# Patient Record
Sex: Male | Born: 2000 | Race: White | Hispanic: No | Marital: Single | Smoking: Current some day smoker
Health system: Southern US, Community
[De-identification: ages and names within clinical notes are randomized; demographics above are authoritative.]

## PROBLEM LIST (undated history)

## (undated) DIAGNOSIS — T7840XA Allergy, unspecified, initial encounter: Secondary | ICD-10-CM

## (undated) DIAGNOSIS — U071 COVID-19: Secondary | ICD-10-CM

## (undated) HISTORY — PX: NASAL SINUS SURGERY: SHX719

## (undated) HISTORY — DX: Allergy, unspecified, initial encounter: T78.40XA

## (undated) HISTORY — PX: WISDOM TOOTH EXTRACTION: SHX21

## (undated) HISTORY — PX: APPENDECTOMY: SHX54

---
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
## 2019-01-02 ENCOUNTER — Other Ambulatory Visit: Payer: Self-pay

## 2019-01-02 DIAGNOSIS — Z20822 Contact with and (suspected) exposure to covid-19: Secondary | ICD-10-CM

## 2019-01-03 LAB — NOVEL CORONAVIRUS, NAA: SARS-CoV-2, NAA: DETECTED — AB

## 2020-01-09 ENCOUNTER — Other Ambulatory Visit: Payer: Self-pay | Admitting: Family

## 2020-01-09 ENCOUNTER — Ambulatory Visit
Admission: RE | Admit: 2020-01-09 | Discharge: 2020-01-09 | Disposition: A | Discharge status: Home / Self Care | Payer: 59 | Source: Ambulatory Visit | Attending: Family | Admitting: Family

## 2020-01-09 DIAGNOSIS — R051 Acute cough: Secondary | ICD-10-CM

## 2020-07-13 ENCOUNTER — Ambulatory Visit: Payer: Self-pay | Admitting: Allergy and Immunology

## 2020-07-23 ENCOUNTER — Ambulatory Visit: Payer: 59 | Admitting: Allergy and Immunology

## 2020-07-23 ENCOUNTER — Encounter: Payer: Self-pay | Admitting: Allergy and Immunology

## 2020-07-23 ENCOUNTER — Other Ambulatory Visit: Payer: Self-pay

## 2020-07-23 ENCOUNTER — Ambulatory Visit (INDEPENDENT_AMBULATORY_CARE_PROVIDER_SITE_OTHER): Payer: 59 | Admitting: Allergy and Immunology

## 2020-07-23 VITALS — BP 110/66 | HR 65 | Temp 98.2°F | Resp 16 | Ht 72.0 in | Wt 162.0 lb

## 2020-07-23 DIAGNOSIS — F17210 Nicotine dependence, cigarettes, uncomplicated: Secondary | ICD-10-CM | POA: Diagnosis not present

## 2020-07-23 DIAGNOSIS — H1013 Acute atopic conjunctivitis, bilateral: Secondary | ICD-10-CM

## 2020-07-23 DIAGNOSIS — F1290 Cannabis use, unspecified, uncomplicated: Secondary | ICD-10-CM

## 2020-07-23 DIAGNOSIS — J301 Allergic rhinitis due to pollen: Secondary | ICD-10-CM | POA: Diagnosis not present

## 2020-07-23 DIAGNOSIS — J3089 Other allergic rhinitis: Secondary | ICD-10-CM

## 2020-07-23 DIAGNOSIS — H1010 Acute atopic conjunctivitis, unspecified eye: Secondary | ICD-10-CM

## 2020-07-23 MED ORDER — MONTELUKAST SODIUM 10 MG PO TABS: 10.0000 mg | ORAL_TABLET | Freq: Every day | ORAL | 5 refills | Status: DC

## 2020-07-23 NOTE — Patient Instructions (Addendum)
  1.  Allergen avoidance measures - dust mite, cat, dog, pollens, molds  2.  Use THC and nicotine substitutes to eliminate smoke exposure  3.  Treat and prevent inflammation:  A.  Triamcinolone nasal spray -1 spray each nostril 1-2 times per day B.  Montelukast 10 mg -1 tablet 1 time per day  4.  If needed:  A. Nasal saline B. Cetirizine 10 mg - 1 tablet 1 time per day C. Pataday - 1 drop each eye 1 time per day  5. Immunotherapy???  6. Return to clinic in 12 weeks or earlier if problem  7.  Obtain fall flu vaccine  8. Return in 6 months or earlier if problem

## 2020-07-23 NOTE — Progress Notes (Signed)
[place] [facility] [place] - [place] - [place]   Dear Scott Benson,  Thank you for referring Scott Benson to the [facility] [place] on 07/23/2020.   Below is a summation of this patient's evaluation and recommendations.  Thank you for your referral. I will keep you informed about this patient's response to treatment.   If you have any questions please do not hesitate to contact me.   Sincerely,  Scott Priest, MD Allergy / Immunology [facility]   ______________________________________________________________________    NEW PATIENT NOTE  Referring Provider: Sheran Spine, NP Primary Provider: Shelva Majestic, MD Date of office visit: 07/23/2020    Subjective:   Chief Complaint:  Scott Benson (DOB: February 06, 2000) is a 20 y.o. male who presents to the clinic on 07/23/2020 with a chief complaint of Allergies .     HPI: Scott Benson presents to this clinic in evaluation of rhinitis.  He has a long history of rhinitis manifested for the most part as nasal congestion and apparently he has had a history of nasal polyps and has required a right-sided nasal polypectomy about 5 years ago.  He does have some runny nose usually of clear nasal discharge and he may have some slight sneezing but is predominantly nasal congestion that bothers him the most.  Provoking factors for his symptoms include exposure to dust and pollens and cats and some of these allergens will also cause him to have some irritation of his eyes.  He does smoke marijuana and tobacco on a daily basis.  He sometimes also vapes.  History reviewed. No pertinent past medical history.  Past Surgical History:  Procedure Laterality Date   APPENDECTOMY     LAPAROSCOPIC APPENDECTOMY N/A 03/27/2012   Procedure: APPENDECTOMY LAPAROSCOPIC;  Surgeon: Scott Petit. [name], MD;  Location: [facility] OR;  Service: Pediatrics;   Laterality: N/A;   NASAL SINUS SURGERY Right    polyp   WISDOM TOOTH EXTRACTION      Allergies as of 07/23/2020   No Known Allergies      Medication List   Triamcinolone nasal spray    Review of systems negative except as noted in HPI / PMHx or noted below:  Review of Systems  Constitutional: Negative.   HENT: Negative.    Eyes: Negative.   Respiratory: Negative.    Cardiovascular: Negative.   Gastrointestinal: Negative.   Genitourinary: Negative.   Musculoskeletal: Negative.   Skin: Negative.   Neurological: Negative.   Endo/Heme/Allergies: Negative.   Psychiatric/Behavioral: Negative.     Family History  Problem Relation Age of Onset   Allergic rhinitis Mother    Allergic rhinitis Father    Allergic rhinitis Sister    Food Allergy Sister    Asthma Neg Hx    Eczema Neg Hx    Urticaria Neg Hx    Immunodeficiency Neg Hx    Angioedema Neg Hx    Atopy Neg Hx     Social History   Socioeconomic History   Marital status: Single    Spouse name: Not on file   Number of children: Not on file   Years of education: Not on file   Highest education level: Not on file  Occupational History   Not on file  Tobacco Use   Smoking status: Every Day    Pack years: 0.00    Types: Cigarettes   Smokeless tobacco: Never  Vaping Use  Vaping Use: Every day  Substance and Sexual Activity   Alcohol use: No   Drug use: No   Sexual activity: Never  Other Topics Concern   Not on file  Social History Narrative   ** Merged History Encounter **       Environmental and Social history  Lives in a house with a dry environment, no animals located inside the household, carpet in the bedroom, no plastic on the bed, no plastic on the pillow, no smoking ongoing with inside the household.  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  Objective:   Vitals:   07/23/20 1403  BP: 110/66  Pulse: 65  Resp: 16   Temp: 98.2 F (36.8 C)  SpO2: 98%   Height: 6' (182.9 cm) Weight: 162 lb (73.5 kg)  Physical Exam Constitutional:      Appearance: He is not diaphoretic.  HENT:     Head: Normocephalic.     Right Ear: Tympanic membrane, ear canal and external ear normal.     Left Ear: Tympanic membrane, ear canal and external ear normal.     Nose: Mucosal edema present. No rhinorrhea.     Mouth/Throat:     Pharynx: Uvula midline. No oropharyngeal exudate.  Eyes:     Conjunctiva/sclera: Conjunctivae normal.  Neck:     Thyroid: No thyromegaly.     Trachea: Trachea normal. No tracheal tenderness or tracheal deviation.  Cardiovascular:     Rate and Rhythm: Normal rate and regular rhythm.     Heart sounds: Normal heart sounds, S1 normal and S2 normal. No murmur heard. Pulmonary:     Effort: No respiratory distress.     Breath sounds: Normal breath sounds. No stridor. No wheezing or rales.  Lymphadenopathy:     Head:     Right side of head: No tonsillar adenopathy.     Left side of head: No tonsillar adenopathy.     Cervical: No cervical adenopathy.  Skin:    Findings: No erythema or rash.     Nails: There is no clubbing.  Neurological:     Mental Status: He is alert.    Diagnostics: Allergy skin tests were performed.  He demonstrated hypersensitivity to house dust mite, cat, dog, trees, grasses, weeds, and molds.  Assessment and Plan:    1. Perennial allergic rhinitis   2. Seasonal allergic rhinitis due to pollen   3. Seasonal allergic conjunctivitis   4. Light tobacco smoker <10 cigarettes per day   5. Marijuana smoker     1.  Allergen avoidance measures - dust mite, cat, dog, pollens, molds  2.  Use THC and nicotine substitutes to eliminate smoke exposure  3.  Treat and prevent inflammation:  A.  Triamcinolone nasal spray -1 spray each nostril 1-2 times per day B.  Montelukast 10 mg -1 tablet 1 time per day  4.  If needed:  A. Nasal saline B. Cetirizine 10 mg - 1 tablet  1 time per day C. Pataday - 1 drop each eye 1 time per day  5. Immunotherapy???  6. Return to clinic in 12 weeks or earlier if problem  7.  Obtain fall flu vaccine  8. Return in 6 months or earlier if problem  Scott Benson has inflammation of his airway and conjunctiva secondary to a combination of atopic disease and smoke exposure and we will attempt to get his issues under better  control with a collection of allergen avoidance measures, eliminating smoke exposure, and using anti-inflammatory agents for his airway on a pretty consistent basis.  I will see him back in his clinic in 12 weeks to assess his response to this approach and if he fails medical therapy he would definitely be a candidate for immunotherapy.  We have given him literature on this form of therapy during today's visit.  Scott Priest, MD Allergy / Immunology [facility] [place]

## 2020-07-28 ENCOUNTER — Encounter: Payer: Self-pay | Admitting: Allergy and Immunology

## 2020-09-17 ENCOUNTER — Encounter: Payer: Self-pay | Admitting: Family Medicine

## 2020-09-17 ENCOUNTER — Ambulatory Visit (INDEPENDENT_AMBULATORY_CARE_PROVIDER_SITE_OTHER): Payer: 59 | Admitting: Family Medicine

## 2020-09-17 ENCOUNTER — Other Ambulatory Visit (HOSPITAL_COMMUNITY)
Admission: RE | Admit: 2020-09-17 | Discharge: 2020-09-17 | Disposition: A | Discharge status: Home / Self Care | Payer: 59 | Source: Ambulatory Visit | Attending: Family Medicine | Admitting: Family Medicine

## 2020-09-17 ENCOUNTER — Other Ambulatory Visit: Payer: Self-pay

## 2020-09-17 VITALS — BP 97/59 | HR 66 | Temp 98.6°F | Ht 72.0 in | Wt 157.8 lb

## 2020-09-17 DIAGNOSIS — Z113 Encounter for screening for infections with a predominantly sexual mode of transmission: Secondary | ICD-10-CM | POA: Diagnosis present

## 2020-09-17 DIAGNOSIS — Z118 Encounter for screening for other infectious and parasitic diseases: Secondary | ICD-10-CM

## 2020-09-17 DIAGNOSIS — R310 Gross hematuria: Secondary | ICD-10-CM

## 2020-09-17 DIAGNOSIS — G8929 Other chronic pain: Secondary | ICD-10-CM | POA: Diagnosis not present

## 2020-09-17 DIAGNOSIS — Z114 Encounter for screening for human immunodeficiency virus [HIV]: Secondary | ICD-10-CM

## 2020-09-17 DIAGNOSIS — Z0000 Encounter for general adult medical examination without abnormal findings: Secondary | ICD-10-CM | POA: Insufficient documentation

## 2020-09-17 DIAGNOSIS — M545 Low back pain, unspecified: Secondary | ICD-10-CM | POA: Diagnosis not present

## 2020-09-17 DIAGNOSIS — Z1159 Encounter for screening for other viral diseases: Secondary | ICD-10-CM | POA: Diagnosis not present

## 2020-09-17 DIAGNOSIS — Z13220 Encounter for screening for lipoid disorders: Secondary | ICD-10-CM | POA: Diagnosis not present

## 2020-09-17 DIAGNOSIS — F17200 Nicotine dependence, unspecified, uncomplicated: Secondary | ICD-10-CM

## 2020-09-17 DIAGNOSIS — Z23 Encounter for immunization: Secondary | ICD-10-CM

## 2020-09-17 DIAGNOSIS — T7840XA Allergy, unspecified, initial encounter: Secondary | ICD-10-CM | POA: Insufficient documentation

## 2020-09-17 LAB — POC URINALSYSI DIPSTICK (AUTOMATED)
Bilirubin, UA: NEGATIVE
Blood, UA: NEGATIVE
Glucose, UA: NEGATIVE
Ketones, UA: NEGATIVE
Leukocytes, UA: NEGATIVE
Nitrite, UA: NEGATIVE
Protein, UA: NEGATIVE
Spec Grav, UA: 1.015 (ref 1.010–1.025)
Urobilinogen, UA: 0.2 E.U./dL
pH, UA: 6.5 (ref 5.0–8.0)

## 2020-09-17 NOTE — Patient Instructions (Addendum)
Health Maintenance Due  Topic Date Due   HPV VACCINES (1 - Male 2-dose series)  Gardasil today. Schedule nurse visit at 2 months and 6 months for repeats. Never done   TETANUS/TDAP today. Never done   Thank you for doing labs today If you have mychart- we will send your results within 3 business days of [place] receiving them.  If you do not have mychart- we will call you about results within 5 business days of [place] receiving them.  *please also note that you will see labs on mychart as soon as they post. I will later go in and write notes on them- will say "notes from Dr. Durene Cal"  In regards to you questions about your lung function, I do think it would be best to quit smoking in the future and increase the amount of exercise you get per week - preferably 150 minutes per week once your back heals.  I do recommend that you start using sunscreen.  I do encourage you to start using condoms.  It was great to meet you today!  We will call you within two weeks about your referral to hematuria. If you do not hear within 2 weeks, give [place] a call.    Recommended follow up: Return in about 1 year (around 09/17/2021) for physical or sooner if needed.

## 2020-09-17 NOTE — Progress Notes (Signed)
Phone: [phone]   Subjective:  Patient presents today to establish care and for physical.  Prior patient of [facility] peds.  Chief Complaint  Patient presents with   Establish Care   See problem oriented charting- ROS- full  review of systems was completed and negative  per full ROS sheet- did admit to occasional  cold sores  The following were reviewed and entered/updated in epic: Past Medical History:  Diagnosis Date   seasonal allergies    flonase as needed works well   Patient Active Problem List   Diagnosis Date Noted   seasonal allergies 09/17/2020   Past Surgical History:  Procedure Laterality Date   LAPAROSCOPIC APPENDECTOMY N/A 03/27/2012   Procedure: APPENDECTOMY LAPAROSCOPIC;  Surgeon: Judie Petit. [name], MD;  Location: [facility] OR;  Service: Pediatrics;  Laterality: N/A;   NASAL SINUS SURGERY Right    polyp   WISDOM TOOTH EXTRACTION      Family History  Problem Relation Age of Onset   Arthritis Mother    Hypertension Father    Heart attack Father        age 66   Hyperlipidemia Father    Allergic rhinitis Sister    Food Allergy Sister    Healthy Sister    Alcohol abuse Paternal Grandfather    Cancer Paternal Grandfather        thyroid and other   Asthma Neg Hx    Eczema Neg Hx    Urticaria Neg Hx    Immunodeficiency Neg Hx    Angioedema Neg Hx    Atopy Neg Hx     Medications- reviewed and updated No current outpatient medications on file.   No current facility-administered medications for this visit.    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 Oropharynx normal Neck: no thyromegaly CV: RRR no murmurs rubs or gallops Lungs: CTAB no crackles, wheeze, rhonchi Abdomen: soft/nontender/nondistended/normal bowel sounds. No rebound or guarding.  Ext: no edema Skin: warm, dry Neuro: grossly normal, moves all extremities, PERRLA     Assessment and Plan:  20 y.o. male presenting for annual physical.  Health Maintenance counseling: 1. Anticipatory guidance: Patient counseled regarding regular dental exams -q6 months, eye exams - no vision issues,  avoiding smoking and second hand smoke- see below , limiting alcohol to 2 beverages per day- 6 per week.   2. Risk factor reduction:  Advised patient of need for regular exercise and diet rich and fruits and vegetables to reduce risk of heart attack and stroke. Exercise- trying to get back to this when back better- goal 5 days a week again once back heals. Diet-oatmeal for breakfast with frozen berries and 3 eggs- doesn't eat well during the day.  Wt Readings from Last 3 Encounters:  09/17/20 157 lb 12.8 oz (71.6 kg)  07/23/20 162 lb (73.5 kg)  11/11/14 136 lb 9 oz (61.9 kg) (61 %,  Z= 0.28)*   * Growth percentiles are based on CDC (Boys, 2-20 Years) data.  3. Immunizations/screenings/ancillary studies. Tdap today. Wants to do gardasil as well.  HC screen opts in.  Immunization History  Administered Date(s) Administered   PFIZER Comirnaty(Gray Top)Covid-19 Tri-Sucrose Vaccine 10/22/2019, 11/12/2019   4. Prostate cancer screening- no family history, start at age 83  5. Colon cancer screening - no family history, start at age 77 6. Skin cancer screening/prevention- no dermatologist. advised regular sunscreen use. Denies worrisome, changing, or new skin lesions.  7. Testicular cancer screening- advised monthly self exams  8. STD screening- patient opts in. Advised to use protection everytime.  9. current smoker- cigars advised to stop. Advised  stopping marijuana as well- he is worried about lung function- advised do edibles if he is not willing to stop marijuana completely  Status of chronic or acute concerns   #allergies controlled with sparing flonase. Is not needing singulair anymore through  (states never took) Proofreader.   #Gross hematuria-patient with an episode when he was on a cruise back in March where he was drinking a lot of alcohol but not drinking much water at all-he was urinating and noted a clear discharge and later light amount of blood.  He was screened for STDs when he returned home and these were negative.  He has not had any episodes of blood in the urine since that time but did have 1 other episode of some slight clear discharge.  We are screening for STDs but we are also can refer to urology for their expert opinion on gross hematuria  Recommended follow up: No follow-ups on file. Future Appointments  Date Time Provider Department Center  10/26/2020 10:00 AM Kozlow, Alvira Philips, MD [facility] None   Lab/Order associations:NOT fasting   ICD-10-CM   1. Preventative health care  Z00.00 Hepatitis C antibody    HIV Antibody (routine testing w rflx)    RPR    Urine cytology ancillary only    CBC with Differential/Platelet    Comprehensive metabolic panel    Lipid panel    Urine cytology ancillary only    2. Encounter for hepatitis C screening test for low risk patient  Z11.59 Hepatitis C antibody    3. Screening for HIV (human immunodeficiency virus)  Z11.4 HIV Antibody (routine testing w rflx)    4. Screening for venereal disease  Z11.3 RPR    5. Screening for gonorrhea  Z11.3 Urine cytology ancillary only    Urine cytology ancillary only    6. Screening for chlamydial disease  Z11.8 Urine cytology ancillary only    Urine cytology ancillary only    7. Screening for hyperlipidemia  Z13.220 Lipid panel    8. Chronic bilateral low back pain without sciatica  M54.50 CBC with Differential/Platelet   G89.29  Comprehensive metabolic panel    9. Current smoker  F17.200 POCT Urinalysis Dipstick (Automated)     No orders of the defined types were placed in this encounter.   Return precautions advised.   Tana Conch, MD

## 2020-09-18 LAB — COMPREHENSIVE METABOLIC PANEL
ALT: 16 U/L (ref 0–53)
AST: 21 U/L (ref 0–37)
Albumin: 4.6 g/dL (ref 3.5–5.2)
Alkaline Phosphatase: 60 U/L (ref 39–117)
BUN: 15 mg/dL (ref 6–23)
CO2: 26 mEq/L (ref 19–32)
Calcium: 9.8 mg/dL (ref 8.4–10.5)
Chloride: 104 mEq/L (ref 96–112)
Creatinine, Ser: 0.96 mg/dL (ref 0.40–1.50)
GFR: 113.08 mL/min (ref >60.00)
Glucose, Bld: 81 mg/dL (ref 70–99)
Potassium: 4.5 mEq/L (ref 3.5–5.1)
Sodium: 138 mEq/L (ref 135–145)
Total Bilirubin: 0.4 mg/dL (ref 0.2–1.2)
Total Protein: 7.4 g/dL (ref 6.0–8.3)

## 2020-09-18 LAB — LIPID PANEL
Cholesterol: 163 mg/dL (ref 0–200)
HDL: 48.4 mg/dL (ref >39.00)
LDL Cholesterol: 99 mg/dL (ref 0–99)
NonHDL: 114.62
Total CHOL/HDL Ratio: 3
Triglycerides: 76 mg/dL (ref 0.0–149.0)
VLDL: 15.2 mg/dL (ref 0.0–40.0)

## 2020-09-18 LAB — CBC WITH DIFFERENTIAL/PLATELET
Basophils Absolute: 0.1 10*3/uL (ref 0.0–0.1)
Basophils Relative: 0.7 % (ref 0.0–3.0)
Eosinophils Absolute: 0.7 10*3/uL (ref 0.0–0.7)
Eosinophils Relative: 10.2 % — ABNORMAL HIGH (ref 0.0–5.0)
HCT: 44 % (ref 39.0–52.0)
Hemoglobin: 15.2 g/dL (ref 13.0–17.0)
Lymphocytes Relative: 31 % (ref 12.0–46.0)
Lymphs Abs: 2.1 10*3/uL (ref 0.7–4.0)
MCHC: 34.5 g/dL (ref 30.0–36.0)
MCV: 89.9 fl (ref 78.0–100.0)
Monocytes Absolute: 0.4 10*3/uL (ref 0.1–1.0)
Monocytes Relative: 5.8 % (ref 3.0–12.0)
Neutro Abs: 3.6 10*3/uL (ref 1.4–7.7)
Neutrophils Relative %: 52.3 % (ref 43.0–77.0)
Platelets: 201 10*3/uL (ref 150.0–400.0)
RBC: 4.89 Mil/uL (ref 4.22–5.81)
RDW: 12.1 % (ref 11.5–15.5)
WBC: 6.9 10*3/uL (ref 4.0–10.5)

## 2020-09-18 LAB — HEPATITIS C ANTIBODY
Hepatitis C Ab: NONREACTIVE
SIGNAL TO CUT-OFF: 0.04 (ref <1.00)

## 2020-09-18 LAB — HIV ANTIBODY (ROUTINE TESTING W REFLEX): HIV 1&2 Ab, 4th Generation: NONREACTIVE

## 2020-09-18 LAB — RPR: RPR Ser Ql: NONREACTIVE

## 2020-09-21 LAB — URINE CYTOLOGY ANCILLARY ONLY
Chlamydia: NEGATIVE
Comment: NEGATIVE
Comment: NEGATIVE
Comment: NORMAL
Neisseria Gonorrhea: NEGATIVE
Trichomonas: NEGATIVE

## 2020-10-16 ENCOUNTER — Encounter: Payer: Self-pay | Admitting: Physician Assistant

## 2020-10-16 ENCOUNTER — Other Ambulatory Visit (HOSPITAL_COMMUNITY)
Admission: RE | Admit: 2020-10-16 | Discharge: 2020-10-16 | Disposition: A | Discharge status: Home / Self Care | Payer: 59 | Source: Ambulatory Visit | Attending: Physician Assistant | Admitting: Physician Assistant

## 2020-10-16 ENCOUNTER — Ambulatory Visit (INDEPENDENT_AMBULATORY_CARE_PROVIDER_SITE_OTHER): Payer: 59 | Admitting: Physician Assistant

## 2020-10-16 ENCOUNTER — Other Ambulatory Visit: Payer: Self-pay

## 2020-10-16 VITALS — BP 118/80 | HR 80 | Temp 98.2°F | Ht 72.0 in | Wt 152.0 lb

## 2020-10-16 DIAGNOSIS — Z7251 High risk heterosexual behavior: Secondary | ICD-10-CM | POA: Insufficient documentation

## 2020-10-16 NOTE — Progress Notes (Signed)
Scott Benson is a 20 y.o. male is here to discuss: STD testing  I acted as a Neurosurgeon for [employer], PA-C Corky Mull, LPN   History of Present Illness:   Chief Complaint  Patient presents with   STD testing    HPI  STD testing Pt is wanting STD testing again today due to unprotected sex multiple times since last checked in August. He reports that last week he had burning with ejaculation 2 times.  He is not using condoms.  He reports that he has never had an STD.  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  He denies any: Discharge from penis, dysuria, lesions on penis or rectum.  Health Maintenance Due  Topic Date Due   HPV VACCINES (2 - Male 3-dose series) 10/15/2020    Past Medical History:  Diagnosis Date   seasonal allergies    flonase as needed works well     Social History   Tobacco Use   Smoking status: Some Days    Types: Cigars   Smokeless tobacco: Former    Types: Snuff  Vaping Use   Vaping Use: Former  Substance Use Topics   Alcohol use: Yes    Alcohol/week: 6.0 standard drinks    Types: 1 Glasses of wine, 5 Shots of liquor per week    Comment: usually less   Drug use: Yes    Types: Marijuana, Amphetamines, Cocaine, LSD, MDMA (Ecstacy)    Comment: currently using marijuana everything else is past usage.    Past Surgical History:  Procedure Laterality Date   LAPAROSCOPIC APPENDECTOMY N/A 03/27/2012   Procedure: APPENDECTOMY LAPAROSCOPIC;  Surgeon: Judie Petit. [name], MD;  Location: [facility];  Service: Pediatrics;  Laterality: N/A;   NASAL SINUS SURGERY Right    polyp   WISDOM TOOTH EXTRACTION      Family History  Problem Relation Age of Onset   Arthritis Mother    Hypertension Father    Heart attack Father        age 12   Hyperlipidemia Father    Allergic rhinitis Sister    Food Allergy Sister    Healthy Sister    Alcohol abuse Paternal Grandfather    Cancer Paternal Grandfather        thyroid and other   Asthma Neg Hx    Eczema Neg  Hx    Urticaria Neg Hx    Immunodeficiency Neg Hx    Angioedema Neg Hx    Atopy Neg Hx     PMHx, SurgHx, SocialHx, FamHx, Medications, and Allergies were reviewed in the Visit Navigator and updated as appropriate.   Patient Active Problem List   Diagnosis Date Noted   seasonal allergies 09/17/2020    Social History   Tobacco Use   Smoking status: Some Days    Types: Cigars   Smokeless tobacco: Former    Types: Snuff  Vaping Use   Vaping Use: Former  Substance Use Topics   Alcohol use: Yes    Alcohol/week: 6.0 standard drinks    Types: 1 Glasses of wine, 5 Shots of liquor per week    Comment: usually less   Drug use: Yes    Types: Marijuana, Amphetamines, Cocaine, LSD, MDMA (Ecstacy)    Comment: currently using marijuana everything else is past usage.    Current Medications and Allergies:   No current outpatient medications on file.  No Known Allergies  Review of Systems   ROS Negative unless otherwise specified per HPI.  Vitals:   Vitals:   10/16/20 1410  BP: 118/80  Pulse: 80  Temp: 98.2 F (36.8 C)  TempSrc: Temporal  SpO2: 97%  Weight: 152 lb (68.9 kg)  Height: 6' (1.829 m)     Body mass index is 20.61 kg/m.   Physical Exam:    Physical Exam Vitals and nursing note reviewed.  Constitutional:      General: He is not in acute distress.    Appearance: He is well-developed. He is not ill-appearing or toxic-appearing.  Cardiovascular:     Rate and Rhythm: Normal rate and regular rhythm.     Pulses: Normal pulses.     Heart sounds: Normal heart sounds, S1 normal and S2 normal.  Pulmonary:     Effort: Pulmonary effort is normal.     Breath sounds: Normal breath sounds.  Skin:    General: Skin is warm and dry.  Neurological:     Mental Status: He is alert.     GCS: GCS eye subscore is 4. GCS verbal subscore is 5. GCS motor subscore is 6.  Psychiatric:        Speech: Speech normal.        Behavior: Behavior normal. Behavior is cooperative.      Assessment and Plan:   High risk sexual behavior, unspecified type Update STD panel today Recommend regular use of condoms Will treat based on results Recommend that he avoid sex until results have returned If burning with ejaculation persist, follow-up with PCP or further evaluation  CMA or LPN served as scribe during this visit. History, Physical, and Plan performed by medical provider. The above documentation has been reviewed and is accurate and complete.  Jarold Motto, PA-C [place] [place] 10/16/2020  Follow-up: No follow-ups on file.

## 2020-10-16 NOTE — Patient Instructions (Signed)
It was great to see you!  We are checking your STD panel today Please avoid sex until we get your results Please use condoms regularly  Take care,  Jarold Motto PA-C

## 2020-10-19 LAB — URINE CYTOLOGY ANCILLARY ONLY
Chlamydia: NEGATIVE
Comment: NEGATIVE
Comment: NEGATIVE
Comment: NORMAL
Neisseria Gonorrhea: NEGATIVE
Trichomonas: NEGATIVE

## 2020-10-19 LAB — RPR: RPR Ser Ql: NONREACTIVE

## 2020-10-19 LAB — HIV ANTIBODY (ROUTINE TESTING W REFLEX): HIV 1&2 Ab, 4th Generation: NONREACTIVE

## 2020-10-26 ENCOUNTER — Ambulatory Visit: Payer: 59 | Admitting: Allergy and Immunology

## 2020-12-05 ENCOUNTER — Other Ambulatory Visit: Payer: Self-pay

## 2020-12-05 ENCOUNTER — Encounter (HOSPITAL_COMMUNITY): Payer: Self-pay | Admitting: Emergency Medicine

## 2020-12-05 ENCOUNTER — Ambulatory Visit (HOSPITAL_COMMUNITY)
Admission: EM | Admit: 2020-12-05 | Discharge: 2020-12-05 | Disposition: A | Discharge status: Home / Self Care | Payer: 59 | Attending: Physician Assistant | Admitting: Physician Assistant

## 2020-12-05 DIAGNOSIS — R21 Rash and other nonspecific skin eruption: Secondary | ICD-10-CM | POA: Diagnosis present

## 2020-12-05 DIAGNOSIS — B001 Herpesviral vesicular dermatitis: Secondary | ICD-10-CM | POA: Diagnosis not present

## 2020-12-05 DIAGNOSIS — L290 Pruritus ani: Secondary | ICD-10-CM | POA: Insufficient documentation

## 2020-12-05 LAB — CBC WITH DIFFERENTIAL/PLATELET
Abs Immature Granulocytes: 0.01 10*3/uL (ref 0.00–0.07)
Basophils Absolute: 0.1 10*3/uL (ref 0.0–0.1)
Basophils Relative: 1 %
Eosinophils Absolute: 0.8 10*3/uL — ABNORMAL HIGH (ref 0.0–0.5)
Eosinophils Relative: 13 %
HCT: 43 % (ref 39.0–52.0)
Hemoglobin: 15 g/dL (ref 13.0–17.0)
Immature Granulocytes: 0 %
Lymphocytes Relative: 32 %
Lymphs Abs: 2 10*3/uL (ref 0.7–4.0)
MCH: 30.8 pg (ref 26.0–34.0)
MCHC: 34.9 g/dL (ref 30.0–36.0)
MCV: 88.3 fL (ref 80.0–100.0)
Monocytes Absolute: 0.4 10*3/uL (ref 0.1–1.0)
Monocytes Relative: 7 %
Neutro Abs: 2.9 10*3/uL (ref 1.7–7.7)
Neutrophils Relative %: 47 %
Platelets: 225 10*3/uL (ref 150–400)
RBC: 4.87 MIL/uL (ref 4.22–5.81)
RDW: 11.9 % (ref 11.5–15.5)
WBC: 6.2 10*3/uL (ref 4.0–10.5)
nRBC: 0 % (ref 0.0–0.2)

## 2020-12-05 MED ORDER — PREDNISONE 10 MG PO TABS: 20.0000 mg | ORAL_TABLET | Freq: Every day | ORAL | 0 refills | Status: DC

## 2020-12-05 MED ORDER — VALACYCLOVIR HCL 1 G PO TABS: 1000.0000 mg | ORAL_TABLET | Freq: Three times a day (TID) | ORAL | 0 refills | Status: DC

## 2020-12-05 NOTE — ED Provider Notes (Signed)
[facility]    CSN: [id number] Arrival date & time: 12/05/20  1028      History   Chief Complaint Chief Complaint  Patient presents with   Rash    HPI Scott Benson is a 20 y.o. male.   Patient here c/w rash R thigh and anal itching x 2 days.  History of cold sores, notes cold sore started on R side lower lip same time as other rashes started.  He admits itching.  Denies f/c, URI sx, cough, wheezing, burning, pain, tenderness, abdominal pain, diarrhea, constipation.  No advil or tylenol today.  He is taking hydrocortisone and lotrimin w/o relief.  No new soaps, detergents, no new foods / restaurants.  No one with similar rash.   Past Medical History:  Diagnosis Date   seasonal allergies    flonase as needed works well    Patient Active Problem List   Diagnosis Date Noted   seasonal allergies 09/17/2020    Past Surgical History:  Procedure Laterality Date   LAPAROSCOPIC APPENDECTOMY N/A 03/27/2012   Procedure: APPENDECTOMY LAPAROSCOPIC;  Surgeon: Judie Petit. [name], MD;  Location: [facility];  Service: Pediatrics;  Laterality: N/A;   NASAL SINUS SURGERY Right    polyp   WISDOM TOOTH EXTRACTION         Home Medications    Prior to Admission medications   Medication Sig Start Date End Date Taking? Authorizing Provider  predniSONE (DELTASONE) 10 MG tablet Take 2 tablets (20 mg total) by mouth daily. 12/05/20  Yes Evern Core, PA-C  valACYclovir (VALTREX) 1000 MG tablet Take 1 tablet (1,000 mg total) by mouth 3 (three) times daily. 12/05/20  Yes Evern Core, PA-C    Family History Family History  Problem Relation Age of Onset   Arthritis Mother    Hypertension Father    Heart attack Father        age 55   Hyperlipidemia Father    Allergic rhinitis Sister    Food Allergy Sister    Healthy Sister    Alcohol abuse Paternal Grandfather    Cancer Paternal Grandfather        thyroid and other   Asthma Neg Hx    Eczema Neg Hx    Urticaria Neg  Hx    Immunodeficiency Neg Hx    Angioedema Neg Hx    Atopy Neg Hx     Social History Social History   Tobacco Use   Smoking status: Some Days    Types: Cigars   Smokeless tobacco: Former    Types: Snuff  Vaping Use   Vaping Use: Former  Substance Use Topics   Alcohol use: Yes    Alcohol/week: 6.0 standard drinks    Types: 1 Glasses of wine, 5 Shots of liquor per week    Comment: usually less   Drug use: Yes    Types: Marijuana, Amphetamines, Cocaine, LSD, MDMA (Ecstacy)    Comment: currently using marijuana everything else is past usage.     Allergies   Patient has no known allergies.   Review of Systems Review of Systems  Constitutional:  Negative for chills, fatigue and fever.  HENT:  Negative for congestion, ear pain, nosebleeds, postnasal drip, rhinorrhea, sinus pressure, sinus pain and sore throat.   Eyes:  Negative for pain and redness.  Respiratory:  Negative for cough, shortness of breath and wheezing.   Gastrointestinal:  Negative for abdominal distention, abdominal pain, anal bleeding, blood in stool, constipation, diarrhea, nausea, rectal pain  and vomiting.  Musculoskeletal:  Negative for arthralgias and myalgias.  Skin:  Positive for rash.  Neurological:  Negative for light-headedness and headaches.  Hematological:  Negative for adenopathy. Does not bruise/bleed easily.  Psychiatric/Behavioral:  Negative for confusion and sleep disturbance.     Physical Exam Triage Vital Signs ED Triage Vitals  Enc Vitals Group     BP 12/05/20 1216 130/68     Pulse Rate 12/05/20 1216 64     Resp 12/05/20 1216 15     Temp 12/05/20 1216 98.1 F (36.7 C)     Temp Source 12/05/20 1216 Oral     SpO2 12/05/20 1216 98 %     Weight --      Height --      Head Circumference --      Peak Flow --      Pain Score 12/05/20 1215 0     Pain Loc --      Pain Edu? --      Excl. in GC? --    No data found.  Updated Vital Signs BP 130/68 (BP Location: Left Arm)   Pulse  64   Temp 98.1 F (36.7 C) (Oral)   Resp 15   SpO2 98%   Visual Acuity Right Eye Distance:   Left Eye Distance:   Bilateral Distance:    Right Eye Near:   Left Eye Near:    Bilateral Near:     Physical Exam Vitals and nursing note reviewed.  Constitutional:      General: He is not in acute distress.    Appearance: Normal appearance. He is not ill-appearing.  HENT:     Head: Normocephalic and atraumatic.     Right Ear: Tympanic membrane and ear canal normal.     Left Ear: Tympanic membrane and ear canal normal.     Nose: No congestion or rhinorrhea.     Mouth/Throat:     Pharynx: No oropharyngeal exudate or posterior oropharyngeal erythema.  Eyes:     General: No scleral icterus.    Extraocular Movements: Extraocular movements intact.     Conjunctiva/sclera: Conjunctivae normal.  Cardiovascular:     Rate and Rhythm: Normal rate and regular rhythm.     Heart sounds: No murmur heard. Pulmonary:     Effort: Pulmonary effort is normal. No respiratory distress.     Breath sounds: Normal breath sounds. No wheezing or rales.  Genitourinary:    Prostate: Not enlarged and not tender.     Rectum: No mass, tenderness, external hemorrhoid or internal hemorrhoid.  Musculoskeletal:     Cervical back: Normal range of motion. No rigidity.  Skin:    Coloration: Skin is not jaundiced.     Findings: Rash present. Rash is macular.     Comments: Macular papular erythematous rash anterior R thigh, non-tender, will blanch  Neurological:     General: No focal deficit present.     Mental Status: He is alert and oriented to person, place, and time.     Motor: No weakness.     Gait: Gait normal.  Psychiatric:        Mood and Affect: Mood normal.        Behavior: Behavior normal.     UC Treatments / Results  Labs (all labs ordered are listed, but only abnormal results are displayed) Labs Reviewed  CBC WITH DIFFERENTIAL/PLATELET - Abnormal; Notable for the following components:       Result Value   Eosinophils Absolute 0.8 (*)  All other components within normal limits    EKG   Radiology No results found.  Procedures Procedures (including critical care time)  Medications Ordered in UC Medications - No data to display  Initial Impression / Assessment and Plan / UC Course  I have reviewed the triage vital signs and the nursing notes.  Pertinent labs & imaging results that were available during my care of the patient were reviewed by me and considered in my medical decision making (see chart for details).     Take medication as prescribed Follow up with PCP if no improvement Hives vs HSV rash anterior thigh Final Clinical Impressions(s) / UC Diagnoses   Final diagnoses:  Rash and nonspecific skin eruption  Pruritus ani  Herpes simplex labialis     Discharge Instructions      If no improvement follow up with PCP for evaluation.     ED Prescriptions     Medication Sig Dispense Auth. Provider   valACYclovir (VALTREX) 1000 MG tablet Take 1 tablet (1,000 mg total) by mouth 3 (three) times daily. 21 tablet Evern Core, PA-C   predniSONE (DELTASONE) 10 MG tablet Take 2 tablets (20 mg total) by mouth daily. 10 tablet Evern Core, PA-C      PDMP not reviewed this encounter.   Evern Core, PA-C 12/05/20 1351

## 2020-12-05 NOTE — Discharge Instructions (Addendum)
If no improvement follow up with PCP for evaluation.

## 2020-12-05 NOTE — ED Triage Notes (Signed)
Pt reports rash in buttocks and penile area for 2 days that is really itchy. Reports tried hydrocortisone and jock itch medications that haven't helped.  Also has cold sore on lip using Abreva for.

## 2020-12-14 ENCOUNTER — Telehealth: Payer: Self-pay

## 2020-12-14 NOTE — Telephone Encounter (Signed)
Spoke with patient, he states he went to UC.  Nurse Assessment Nurse: White, RN, Graylon Gunning Date/Time (Eastern Time): 12/12/2020 5:07:12 PM Confirm and document reason for call. If symptomatic, describe symptoms. ---Caller states he thinks he has strep throat and is looking for antibiotics. Sore, swollen throat. No fever. Symptoms began 2 days ago. Headache  Does the patient have any new or worsening symptoms? ---Yes Will a triage be completed? ---Yes Related visit to physician within the last 2 weeks? ---Yes Does the PT have any chronic conditions? (i.e. diabetes, asthma, this includes High risk factors for pregnancy, etc.) --No Is this a behavioral health or substance abuse call? ---No.  Sore Throat  [1] Rash  [2] widespread (especially chest and abdomen) Cliffton Asters RN, [facility] 12/12/2020 5:08:59 PM  12/12/2020 4:10:25 PM Send To Clinical Follow Up Lonzo Cloud, RN, [facility] 12/12/2020 5:14:54 PM See PCP within 24 Hours Yes Cliffton Asters, RN, Graylon Gunning

## 2020-12-16 ENCOUNTER — Other Ambulatory Visit: Payer: Self-pay

## 2020-12-16 ENCOUNTER — Emergency Department (INDEPENDENT_AMBULATORY_CARE_PROVIDER_SITE_OTHER)
Admission: EM | Admit: 2020-12-16 | Discharge: 2020-12-16 | Disposition: A | Discharge status: Home / Self Care | Payer: 59 | Source: Home / Self Care

## 2020-12-16 ENCOUNTER — Encounter: Payer: Self-pay | Admitting: Emergency Medicine

## 2020-12-16 DIAGNOSIS — R491 Aphonia: Secondary | ICD-10-CM

## 2020-12-16 DIAGNOSIS — J029 Acute pharyngitis, unspecified: Secondary | ICD-10-CM | POA: Diagnosis not present

## 2020-12-16 HISTORY — DX: COVID-19: U07.1

## 2020-12-16 NOTE — ED Triage Notes (Signed)
Pt has had a rash to his rectum x 2 weeks  C/o itching  Sore throat x 1 week- hoarse voice  Pt has been taking benadryl for rash whichhas now spread all over his body per pt States he feels "dry' This is his 3rd visit to an Urgent Care in the last week

## 2020-12-16 NOTE — ED Notes (Signed)
POCt for strep refused by pt at this time - states he had a negative strep test done at an Urgent Care in [facility] 2 days ago

## 2020-12-16 NOTE — Discharge Instructions (Addendum)
Advised patient to rest voice for the next 4 to 5 days.  Advised patient to follow-up with PCP for unresolved rash.

## 2020-12-16 NOTE — ED Provider Notes (Signed)
[facility]    CSN: [id number] Arrival date & time: 12/16/20  1110      History   Chief Complaint Chief Complaint  Patient presents with   Sore Throat    HPI Scott Benson is a 20 y.o. male.   HPI Patient presents with sore throat for 1 week and hoarse voice.  Patient was evaluated on 12/04/2020 for rash, prescribe prednisone and patient was to follow-up with primary care doctor 3 days later, if rash has worsened and or unresolved.  Patient reports has not followed up with his primary care regarding rash.  Was going to another urgent care in [place], [place] where rapid strep was negative.  Past Medical History:  Diagnosis Date   COVID-19    has had it 3 times - rec'd one vaccine   seasonal allergies    flonase as needed works well    Patient Active Problem List   Diagnosis Date Noted   seasonal allergies 09/17/2020    Past Surgical History:  Procedure Laterality Date   LAPAROSCOPIC APPENDECTOMY N/A 03/27/2012   Procedure: APPENDECTOMY LAPAROSCOPIC;  Surgeon: Judie Petit. [name], MD;  Location: [facility];  Service: Pediatrics;  Laterality: N/A;   NASAL SINUS SURGERY Right    polyp   WISDOM TOOTH EXTRACTION         Home Medications    Prior to Admission medications   Medication Sig Start Date End Date Taking? Authorizing Provider  predniSONE (DELTASONE) 10 MG tablet Take 2 tablets (20 mg total) by mouth daily. Patient not taking: Reported on 12/16/2020 12/05/20   Evern Core, PA-C  valACYclovir (VALTREX) 1000 MG tablet Take 1 tablet (1,000 mg total) by mouth 3 (three) times daily. Patient not taking: Reported on 12/16/2020 12/05/20   Evern Core, PA-C    Family History Family History  Problem Relation Age of Onset   Arthritis Mother    Hypertension Father    Heart attack Father        age 69   Hyperlipidemia Father    Allergic rhinitis Sister    Food Allergy Sister    Healthy Sister    Alcohol abuse Paternal Grandfather     Cancer Paternal Grandfather        thyroid and other   Asthma Neg Hx    Eczema Neg Hx    Urticaria Neg Hx    Immunodeficiency Neg Hx    Angioedema Neg Hx    Atopy Neg Hx     Social History Social History   Tobacco Use   Smoking status: Some Days    Types: Cigars   Smokeless tobacco: Former    Types: Snuff  Vaping Use   Vaping Use: Former  Substance Use Topics   Alcohol use: Yes    Alcohol/week: 6.0 standard drinks    Types: 1 Glasses of wine, 5 Shots of liquor per week    Comment: usually less   Drug use: Yes    Types: Marijuana, Amphetamines, Cocaine, LSD, MDMA (Ecstacy)    Comment: currently using marijuana everything else is past usage.     Allergies   Patient has no known allergies.   Review of Systems Review of Systems  HENT:  Positive for sore throat.   All other systems reviewed and are negative.   Physical Exam Triage Vital Signs ED Triage Vitals  Enc Vitals Group     BP --      Pulse Rate 12/16/20 1229 72     Resp  12/16/20 1229 15     Temp 12/16/20 1229 99.1 F (37.3 C)     Temp Source 12/16/20 1229 Oral     SpO2 12/16/20 1229 99 %     Weight 12/16/20 1231 153 lb (69.4 kg)     Height 12/16/20 1231 6' (1.829 m)     Head Circumference --      Peak Flow --      Pain Score 12/16/20 1231 3     Pain Loc --      Pain Edu? --      Excl. in GC? --    No data found.  Updated Vital Signs Pulse 72   Temp 99.1 F (37.3 C) (Oral)   Resp 15   Ht 6' (1.829 m)   Wt 153 lb (69.4 kg)   SpO2 99%   BMI 20.75 kg/m   Physical Exam Vitals and nursing note reviewed.  Constitutional:      General: He is not in acute distress.    Appearance: He is well-developed and normal weight. He is not ill-appearing.  HENT:     Head: Normocephalic and atraumatic.     Right Ear: Tympanic membrane and ear canal normal.     Left Ear: Tympanic membrane and ear canal normal.     Mouth/Throat:     Mouth: Mucous membranes are moist.     Pharynx: Oropharynx is clear.  Uvula midline. Posterior oropharyngeal erythema present.  Eyes:     Conjunctiva/sclera: Conjunctivae normal.     Pupils: Pupils are equal, round, and reactive to light.  Cardiovascular:     Rate and Rhythm: Normal rate and regular rhythm.     Heart sounds: Normal heart sounds.  Pulmonary:     Effort: Pulmonary effort is normal.     Breath sounds: Normal breath sounds.  Musculoskeletal:     Cervical back: Normal range of motion and neck supple.  Skin:    General: Skin is warm and dry.  Neurological:     General: No focal deficit present.     Mental Status: He is alert and oriented to person, place, and time.     UC Treatments / Results  Labs (all labs ordered are listed, but only abnormal results are displayed) Labs Reviewed - No data to display  EKG   Radiology No results found.  Procedures Procedures (including critical care time)  Medications Ordered in UC Medications - No data to display  Initial Impression / Assessment and Plan / UC Course  I have reviewed the triage vital signs and the nursing notes.  Pertinent labs & imaging results that were available during my care of the patient were reviewed by me and considered in my medical decision making (see chart for details).     MDM: 1.  Sore throat-patient refused strep test as his says that his been performed at another urgent care; 2.  Loss of voice-advised patient to rest voice for the next 4 to 5 days. Advised patient to rest voice for the next 4 to 5 days.  Advised patient to follow-up with PCP for unresolved rash. Discharged home, hemodynamically stable. Final Clinical Impressions(s) / UC Diagnoses   Final diagnoses:  Sore throat  Loss of voice     Discharge Instructions      Advised patient to rest voice for the next 4 to 5 days.  Advised patient to follow-up with PCP for unresolved rash.     ED Prescriptions   None    PDMP  not reviewed this encounter.   Trevor Iha, FNP 12/16/20  1350

## 2020-12-23 ENCOUNTER — Ambulatory Visit (INDEPENDENT_AMBULATORY_CARE_PROVIDER_SITE_OTHER): Payer: 59 | Admitting: Family

## 2020-12-23 ENCOUNTER — Other Ambulatory Visit: Payer: Self-pay

## 2020-12-23 ENCOUNTER — Encounter: Payer: Self-pay | Admitting: Family

## 2020-12-23 VITALS — BP 118/60 | HR 71 | Temp 98.2°F | Ht 74.0 in | Wt 157.5 lb

## 2020-12-23 DIAGNOSIS — L290 Pruritus ani: Secondary | ICD-10-CM | POA: Diagnosis not present

## 2020-12-23 DIAGNOSIS — R490 Dysphonia: Secondary | ICD-10-CM

## 2020-12-23 MED ORDER — HYDROCORTISONE (PERIANAL) 2.5 % EX CREA: 1.0000 "application " | TOPICAL_CREAM | Freq: Two times a day (BID) | CUTANEOUS | 0 refills | Status: DC

## 2020-12-23 MED ORDER — FAMOTIDINE 20 MG PO TABS: 20.0000 mg | ORAL_TABLET | Freq: Two times a day (BID) | ORAL | 0 refills | Status: DC

## 2020-12-23 MED ORDER — HYDROCORTISONE ACETATE 25 MG RE SUPP: 25.0000 mg | Freq: Two times a day (BID) | RECTAL | 0 refills | Status: DC

## 2020-12-23 NOTE — Progress Notes (Signed)
Subjective:     Patient ID: Scott Benson, male    DOB: 2000-08-30, 20 y.o.   MRN: [id number]  Chief Complaint  Patient presents with   Laryngitis    Started two or 3 weeks ago Coughs up some mucus    Rash    Irritation in rectum, was a rash but now just itches   HPI:  Persistent hoarseness: Patient complains of hoarseness.  Associated symptoms include hoarseness, nasal blockage, post nasal drip, and sinus and nasal congestion.Onset of symptoms was 3 weeks ago, unchanged since that time. He is drinking moderate amounts of fluids. He has not had recent close exposure to someone with proven streptococcal pharyngitis.  Rectal irritation: Patient complains of evaluation of rectal itching. Onset of symptoms was gradual starting 3 weeks ago ago with unchanged course since that time.  He describes symptoms as anorectal itching and painful defecation. Treatment to date has been OTC creams: not very effective. Patient denies  recent STDs , or constipation.   Health Maintenance Due  Topic Date Due   HPV VACCINES (2 - Risk male 3-dose series) 10/15/2020    Past Medical History:  Diagnosis Date   COVID-19    has had it 3 times - rec'd one vaccine   seasonal allergies    flonase as needed works well    Past Surgical History:  Procedure Laterality Date   LAPAROSCOPIC APPENDECTOMY N/A 03/27/2012   Procedure: APPENDECTOMY LAPAROSCOPIC;  Surgeon: Judie Petit. [name], MD;  Location: [facility];  Service: Pediatrics;  Laterality: N/A;   NASAL SINUS SURGERY Right    polyp   WISDOM TOOTH EXTRACTION      Outpatient Medications Prior to Visit  Medication Sig Dispense Refill   predniSONE (DELTASONE) 10 MG tablet Take 2 tablets (20 mg total) by mouth daily. (Patient not taking: Reported on 12/16/2020) 10 tablet 0   valACYclovir (VALTREX) 1000 MG tablet Take 1 tablet (1,000 mg total) by mouth 3 (three) times daily. (Patient not taking: Reported on 12/16/2020) 21 tablet 0   No facility-administered  medications prior to visit.    No Known Allergies      Objective:    Physical Exam Vitals and nursing note reviewed.  Constitutional:      General: He is not in acute distress.    Appearance: Normal appearance.  HENT:     Head: Normocephalic.     Right Ear: Tympanic membrane and ear canal normal.     Left Ear: Tympanic membrane and ear canal normal.     Nose:     Right Sinus: No frontal sinus tenderness.     Left Sinus: No frontal sinus tenderness.     Mouth/Throat:     Mouth: Mucous membranes are moist.     Pharynx: Oropharyngeal exudate and posterior oropharyngeal erythema present. No pharyngeal swelling.     Tonsils: No tonsillar exudate or tonsillar abscesses.  Cardiovascular:     Rate and Rhythm: Normal rate and regular rhythm.  Pulmonary:     Effort: Pulmonary effort is normal.     Breath sounds: Normal breath sounds.  Musculoskeletal:        General: Normal range of motion.     Cervical back: Normal range of motion.  Lymphadenopathy:     Cervical: No cervical adenopathy.  Skin:    General: Skin is warm and dry.     Findings: No erythema, signs of injury, lesion or rash.     Comments: No abnormality found on rectal exam  Neurological:     Mental Status: He is alert and oriented to person, place, and time.  Psychiatric:        Mood and Affect: Mood normal.    BP 118/60   Pulse 71   Temp 98.2 F (36.8 C) (Temporal)   Ht 6\' 2"  (1.88 m)   Wt 157 lb 8 oz (71.4 kg)   SpO2 96%   BMI 20.22 kg/m  Wt Readings from Last 3 Encounters:  12/23/20 157 lb 8 oz (71.4 kg)  12/16/20 153 lb (69.4 kg)  10/16/20 152 lb (68.9 kg)       Assessment & Plan:   Problem List Items Addressed This Visit       Musculoskeletal and Integument   Rectal itching    Seen in ER, given cream that has not helped. No visible hemorrhoid externally, no erythema or lesions, no pain with digital exam. Advised could be internal hemorrhoid that I could not palpate, or pinworms, but pt  denies seeing any in stool. Sending trial of steroid suppositories and cream to try for 1 week.      Relevant Medications   hydrocortisone (ANUSOL-HC) 25 MG suppository   hydrocortisone (ANUSOL-HC) 2.5 % rectal cream     Other   Hoarseness of voice - Primary    Seen in ER recently, told he had a virus and should resolve. Denies heartburn, but does have postnasal drip, states he is allergic "to everything", but does not take a daily med. Did use Nasacort before that got rid of his sinus symptoms so he stopped using. Advised restarting this daily or twice a day for the next week and see if hoarseness improves. If not, then I also sent pepcid to try for one week to rule out GERD sx possibly causing the hoarseness.      Relevant Medications   famotidine (PEPCID) 20 MG tablet    Meds ordered this encounter  Medications   hydrocortisone (ANUSOL-HC) 25 MG suppository    Sig: Place 1 suppository (25 mg total) rectally 2 (two) times daily.    Dispense:  12 suppository    Refill:  0    Order Specific Question:   Supervising Provider    Answer:   ANDY, CAMILLE L [[id number]]   hydrocortisone (ANUSOL-HC) 2.5 % rectal cream    Sig: Place 1 application rectally 2 (two) times daily.    Dispense:  30 g    Refill:  0    Order Specific Question:   Supervising Provider    Answer:   ANDY, CAMILLE L [[id number]]   famotidine (PEPCID) 20 MG tablet    Sig: Take 1 tablet (20 mg total) by mouth 2 (two) times daily.    Dispense:  30 tablet    Refill:  0    Order Specific Question:   Supervising Provider    Answer:   ANDY, CAMILLE L [[id number]]   Extra time spent with patient today which consisted of chart review (ER notes), discussing diagnoses, work up, treatment,  answering questions, and documentation.

## 2020-12-23 NOTE — Patient Instructions (Signed)
It was very nice to see you today!  I have sent the suppositories to your pharmacy, use 1 twice a day until all gone, about 6-7 days. I also sent a stronger steroid cream to use around your rectum to help with itching. Call the office if symptoms are not resolving. Use the Triamcinolone nasal spray you already have,  daily, 1-2 sprays each nostril daily for at least a week to see if the mucus/post-nasal drip decreases which may be contributing to your hoarseness. I also have sent Pepcid to your pharmacy to start twice a day before meals to decrease any reflux symptoms that may be causing the hoarseness also. You may want to wait a week before starting the pills to see if the nasal spray works first.    PLEASE NOTE:  If you had any lab tests please let us know if you have not heard back within a few days. You may see your results on MyChart before we have a chance to review them but we will give you a call once they are reviewed by [place]. If we ordered any referrals today, please let us know if you have not heard from their office within the next week.   Please try these tips to maintain a healthy lifestyle:  Eat most of your calories during the day when you are active. Eliminate processed foods including packaged sweets (pies, cakes, cookies), reduce intake of potatoes, white bread, white pasta, and white rice. Look for whole grain options, oat flour or almond flour.  Each meal should contain half fruits/vegetables, one quarter protein, and one quarter carbs (no bigger than a computer mouse).  Cut down on sweet beverages. This includes juice, soda, and sweet tea. Also watch fruit intake, though this is a healthier sweet option, it still contains natural sugar! Limit to 3 servings daily.  Drink at least 1 glass of water with each meal and aim for at least 8 glasses per day  Exercise at least 150 minutes every week.

## 2020-12-24 NOTE — Assessment & Plan Note (Signed)
Seen in ER recently, told he had a virus and should resolve. Denies heartburn, but does have postnasal drip, states he is allergic "to everything", but does not take a daily med. Did use Nasacort before that got rid of his sinus symptoms so he stopped using. Advised restarting this daily or twice a day for the next week and see if hoarseness improves. If not, then I also sent pepcid to try for one week to rule out GERD sx possibly causing the hoarseness.

## 2020-12-24 NOTE — Assessment & Plan Note (Signed)
Seen in ER, given cream that has not helped. No visible hemorrhoid externally, no erythema or lesions, no pain with digital exam. Advised could be internal hemorrhoid that I could not palpate, or pinworms, but pt denies seeing any in stool. Sending trial of steroid suppositories and cream to try for 1 week.

## 2021-05-31 ENCOUNTER — Telehealth: Payer: Self-pay | Admitting: Family Medicine

## 2021-05-31 ENCOUNTER — Ambulatory Visit: Payer: 59 | Admitting: Physician Assistant

## 2021-05-31 ENCOUNTER — Ambulatory Visit (INDEPENDENT_AMBULATORY_CARE_PROVIDER_SITE_OTHER): Payer: 59 | Admitting: Family

## 2021-05-31 ENCOUNTER — Encounter: Payer: Self-pay | Admitting: Family

## 2021-05-31 VITALS — BP 100/68 | HR 64 | Temp 98.2°F | Ht 74.0 in | Wt 155.5 lb

## 2021-05-31 DIAGNOSIS — B009 Herpesviral infection, unspecified: Secondary | ICD-10-CM

## 2021-05-31 MED ORDER — VALACYCLOVIR HCL 1 G PO TABS: 1000.0000 mg | ORAL_TABLET | Freq: Three times a day (TID) | ORAL | 0 refills | Status: DC

## 2021-05-31 MED ORDER — VALACYCLOVIR HCL 1 G PO TABS: 2000.0000 mg | ORAL_TABLET | Freq: Two times a day (BID) | ORAL | 0 refills | Status: DC

## 2021-05-31 NOTE — Telephone Encounter (Signed)
Patient has called back.  Scheduled with Bufford Buttner at 240pm today for treatment.

## 2021-05-31 NOTE — Telephone Encounter (Signed)
Patient was called to be sch for a FU apt with Dr Durene Cal or a different provider in the office. Patient refused - patient asked why he needs to be seen for a cold sore and medication refills - I explained to him dr Durene Cal has not seen him for this issue and he needed to be seen, last time he was here was on 12/23/20 for sore throat, 10/16/20 for std ck and 09/17/20 as a new patient.- PATIENT refused to be seen.  ? ? ?Patient ?Name: ?Scott HULBERT ?Benson ?Gender: Male ?DOB: 11-04-2000 ?Age: 21 Y 1 M 2 D ?Return ?Phone ?Number: ?[phone] ?(Primary) ?Address: ?City/ ?State/ ?Zip: ?[place] ? [address] ?[facility] - ?[facility] ?[facility] at [facility] ?Provider Tana Conch- MD ?Contact Type Call ?Who Is Calling Patient / Member / Family / Caregiver ?Call Type Triage / Clinical ?Caller Name Scott Benson ?Relationship To Patient Self ?Return Phone Number [phone] (Primary) ?Chief Complaint Cold Sores ?Reason for Call Symptomatic / Request for Health Information ?Initial Comment Caller states he is needing a refill for Valacyclovir ?for a cold sore. He is wanting the order with 2 ?additional refills. He uses [facility] ?[phone]) ?Translation No ?Nurse Assessment ?Nurse: Benna Dunks, RN, Lajoyce Corners Date/Time Lamount Cohen Time): 05/30/2021 2:12:14 PM ?Confirm and document reason for call. If ?symptomatic, describe symptoms. ?---Caller states he is needing a refill for acyclovir for a ?cold sore. States this has happened prior. He is wanting ?the order with 2 additional refills. He uses [facility] [phone]). denies fever, SOB, or ?bleeding. ?Does the patient have any new or worsening ?symptoms? ---Yes ?Will a triage be completed? ---Yes ?Related visit to physician within the last 2 weeks? ---No ?Does the PT have any chronic conditions? (i.e. ?diabetes, asthma, this includes High risk factors for ?pregnancy, etc.) ?---No ?Is this a behavioral  health or substance abuse call? ---No ?Guidelines ?Guideline Title Affirmed Question Affirmed Notes Nurse Date/Time (Eastern ?Time) ?Cold Sores (Fever ?Blisters) ?[1] Red streak or red ?area spreading from ?cold sore AND [2] no ?fever ?Benna Dunks, RN, Lajoyce Corners 05/30/2021 2:13:32 PM ?PLEASE NOTE: All timestamps contained within this report are represented as [place] Standard Time. ?CONFIDENTIALTY NOTICE: This fax transmission is intended only for the addressee. It contains information that is legally privileged, confidential or ?otherwise protected from use or disclosure. If you are not the intended recipient, you are strictly prohibited from reviewing, disclosing, copying using ?or disseminating any of this information or taking any action in reliance on or regarding this information. If you have received this fax in error, please ?notify us immediately by telephone so that we can arrange for its return to [place]. Phone: [phone], Toll-Free: [phone], Fax: [phone] ?Page: 2 of 2 ?Call Id: [id number] ?Disp. Time (Eastern ?Time) Disposition Final User ?05/30/2021 2:17:19 PM See PCP within 24 Hours Yes Benna Dunks, RN, Lajoyce Corners ?Caller Disagree/Comply Comply ?Caller Understands Yes ?PreDisposition Call Doctor ?Care Advice Given Per Guideline ?SEE PCP WITHIN 24 HOURS: CLEANSING: * Wash the infected area with warm water and an antibacterial soap. * Do this three ?times a day. CALL BACK IF: * You become worse * Fever occurs CARE ADVICE given per Fever Blisters of Lip (Cold Sores) ?(Adult) guideline. ?Referrals ?REFERRED TO PCP OFFIC ?

## 2021-05-31 NOTE — Progress Notes (Signed)
? ?Subjective:  ? ? ? Patient ID: VIRAAT SOUDER, male    DOB: 2000/12/31, 21 y.o.   MRN: [id number] ? ?Chief Complaint  ?Patient presents with  ? Herpes Zoster  ?  Pt c/o cold sores and would like a refill on valtrex.   ? ? ?HPI: ?Herpes Simplex: cold sores- pt reports having more episodes lately, about q17m due to increased stress. Requesting a refill today. ? ? ?Assessment & Plan:  ? ?Problem List Items Addressed This Visit   ? ?  ? Other  ? Herpes simplex - Primary  ?  Chronic - pt has been having about every 2 months due to ongoing stress, requesting refill on his Valtrex RX. ? ?  ?  ? Relevant Medications  ? valACYclovir (VALTREX) 1000 MG tablet  ? ? ?Outpatient Medications Prior to Visit  ?Medication Sig Dispense Refill  ? valACYclovir (VALTREX) 1000 MG tablet Take 1 tablet (1,000 mg total) by mouth 3 (three) times daily. 21 tablet 0  ? famotidine (PEPCID) 20 MG tablet Take 1 tablet (20 mg total) by mouth 2 (two) times daily. (Patient not taking: Reported on 05/31/2021) 30 tablet 0  ? hydrocortisone (ANUSOL-HC) 2.5 % rectal cream Place 1 application rectally 2 (two) times daily. (Patient not taking: Reported on 05/31/2021) 30 g 0  ? hydrocortisone (ANUSOL-HC) 25 MG suppository Place 1 suppository (25 mg total) rectally 2 (two) times daily. (Patient not taking: Reported on 05/31/2021) 12 suppository 0  ? predniSONE (DELTASONE) 10 MG tablet Take 2 tablets (20 mg total) by mouth daily. (Patient not taking: Reported on 05/31/2021) 10 tablet 0  ? ?No facility-administered medications prior to visit.  ? ? ?Past Medical History:  ?Diagnosis Date  ? COVID-19   ? has had it 3 times - rec'd one vaccine  ? seasonal allergies   ? flonase as needed works well  ? ? ?Past Surgical History:  ?Procedure Laterality Date  ? LAPAROSCOPIC APPENDECTOMY N/A 03/27/2012  ? Procedure: APPENDECTOMY LAPAROSCOPIC;  Surgeon: Jerilynn Mages. Gerald Stabs, MD;  Location: [facility];  Service: Pediatrics;  Laterality: N/A;  ? NASAL SINUS SURGERY Right   ? polyp  ?  WISDOM TOOTH EXTRACTION    ? ? ?No Known Allergies ? ?   ?Objective:  ?  ?Physical Exam ?Vitals and nursing note reviewed.  ?Constitutional:   ?   General: He is not in acute distress. ?   Appearance: Normal appearance.  ?HENT:  ?   Head: Normocephalic.  ?Cardiovascular:  ?   Rate and Rhythm: Normal rate and regular rhythm.  ?Pulmonary:  ?   Effort: Pulmonary effort is normal.  ?   Breath sounds: Normal breath sounds.  ?Musculoskeletal:     ?   General: Normal range of motion.  ?   Cervical back: Normal range of motion.  ?Skin: ?   General: Skin is warm and dry.  ?Neurological:  ?   Mental Status: He is alert and oriented to person, place, and time.  ?Psychiatric:     ?   Mood and Affect: Mood normal.  ? ? ?BP 100/68 (BP Location: Left Arm, Patient Position: Sitting, Cuff Size: Large)   Pulse 64   Temp 98.2 ?F (36.8 ?C) (Temporal)   Ht 6\' 2"  (1.88 m)   Wt 155 lb 8 oz (70.5 kg)   SpO2 96%   BMI 19.97 kg/m?  ?Wt Readings from Last 3 Encounters:  ?05/31/21 155 lb 8 oz (70.5 kg)  ?12/23/20 157 lb 8 oz (71.4  kg)  ?12/16/20 153 lb (69.4 kg)  ? ? ?   ? ?Jeanie Sewer, NP ? ?

## 2021-05-31 NOTE — Assessment & Plan Note (Signed)
Chronic - pt has been having about every 2 months due to ongoing stress, requesting refill on his Valtrex RX. ?

## 2021-05-31 NOTE — Telephone Encounter (Signed)
FYI

## 2021-08-30 ENCOUNTER — Encounter: Payer: Self-pay | Admitting: Family Medicine

## 2021-08-30 ENCOUNTER — Ambulatory Visit (INDEPENDENT_AMBULATORY_CARE_PROVIDER_SITE_OTHER): Payer: 59 | Admitting: Family Medicine

## 2021-08-30 VITALS — BP 100/70 | HR 58 | Temp 98.0°F | Ht 74.0 in | Wt 158.0 lb

## 2021-08-30 DIAGNOSIS — R41840 Attention and concentration deficit: Secondary | ICD-10-CM

## 2021-08-30 DIAGNOSIS — F1911 Other psychoactive substance abuse, in remission: Secondary | ICD-10-CM

## 2021-08-30 NOTE — Progress Notes (Signed)
Phone [phone] In person visit   Subjective:   Scott Benson is a 21 y.o. year old very pleasant male patient who presents for/with See problem oriented charting Chief Complaint  Patient presents with   ADHD    Pt states he would like to get on medication being diagnosed a few years ago, he recently tried his sisters adderall and that helped his fogginess. He has experienced extreme levels of concentration issues.    Past Medical History-  Patient Active Problem List   Diagnosis Date Noted   Herpes simplex 05/31/2021    Priority: Low   seasonal allergies 09/17/2020    Priority: Low   Hoarseness of voice 12/23/2020    Priority: 1.   Rectal itching 12/23/2020    Priority: 1.   Attention and concentration deficit 08/30/2021    Medications- reviewed and updated Current Outpatient Medications  Medication Sig Dispense Refill   valACYclovir (VALTREX) 1000 MG tablet Take 2 tablets (2,000 mg total) by mouth 2 (two) times daily. Take for 1 day at first sign of tingling on mouth. 21 tablet 0   No current facility-administered medications for this visit.     Objective:  BP 100/70   Pulse (!) 58   Temp 98 F (36.7 C)   Ht 6\' 2"  (1.88 m)   Wt 158 lb (71.7 kg)   SpO2 97%   BMI 20.29 kg/m  Gen: NAD, resting comfortably CV: RRR no murmurs rubs or gallops Lungs: CTAB  Ext: no edema Skin: warm, dry     Assessment and Plan   # ADD concern S:Original diagnosis:He believes he was diagnosed as a child. He reports trying vyvanse 1-2 months ago from a friend (was 60 mg dose- very productive- poor appetite, low appetite). He feels he is struggling to maintain focus. He was never on medicine when he was younger. Reports long term history of difficulty sitting still.   Family history: reports sisters both have ADD and are on adderall.   He reports he struggles with not being able to sit still and having difficulty focusing both at home and at [facility] at [facility] and [employer]  medication: none  Controlled substance contract: discussed would be needed NCCSRS/PDMP reviewed: no rx  Substance abuse history: Overdose listed for alcohol, acid and Xanax in 2017-patient reports did have alcohol and acid overdose but denies Xanax-do not see UDS at that time.  More recently has been using marijuana only.  Only about 6 alcoholic beverages per week  A/P: With prior diagnosis it does sound that patient may have ADD-discussed option of trying to get pediatric records initially but after discovering substance abuse history I told patient I thought would be best for him to be cared for by psychiatry-referral to [employer] attention specialist was made -I told him it was certainly possible with substance abuse history they may not recommend stimulant especially with prior overdose though has done well since 2017.  Has also tried amphetamines, cocaine, LSD, marijuana, MDMA in the past per history.  I am glad he has remained off of these and recent memory with the exception of marijuana- Recommended stopping marijuana as well  # Tobacco abuse S:not smoking cigarettes or vapes in last 3 week A/P: Cigarette free for 3 weeks-recommended sustained cessation  Recommended follow up: Return in about 6 months (around 03/02/2022) for physical or sooner if needed.Schedule b4 you leave. Future Appointments  Date Time Provider Department Center  02/28/2022  2:00 PM 04/29/2022, MD  [facility]    Lab/Order associations:   ICD-10-CM   1. History of substance abuse (HCC)  F19.11     2. Attention and concentration deficit  R41.840 Ambulatory referral to Psychiatry       Time Spent: 24 minutes of total time (2:35 PM- 2:59 PM) was spent on the date of the encounter performing the following actions: chart review prior to seeing the patient, obtaining history, performing a medically necessary exam, counseling on the treatment plan, placing orders, and documenting in our EHR.    Return  precautions advised.  Tana Conch, MD

## 2021-08-30 NOTE — Patient Instructions (Addendum)
Declined flu shot  We will call you within two weeks about your referral to [place] attention specialists. If you do not hear within 2 weeks, give their office a call directly  Recommended follow up: Return in about 6 months (around 03/02/2022) for physical or sooner if needed.Schedule b4 you leave.

## 2021-09-06 ENCOUNTER — Encounter: Payer: Self-pay | Admitting: Physician Assistant

## 2021-09-06 ENCOUNTER — Other Ambulatory Visit: Payer: 59

## 2021-09-06 ENCOUNTER — Telehealth: Payer: Self-pay | Admitting: Family Medicine

## 2021-09-06 ENCOUNTER — Other Ambulatory Visit (HOSPITAL_COMMUNITY)
Admission: RE | Admit: 2021-09-06 | Discharge: 2021-09-06 | Disposition: A | Discharge status: Home / Self Care | Payer: 59 | Source: Ambulatory Visit | Attending: Physician Assistant | Admitting: Physician Assistant

## 2021-09-06 ENCOUNTER — Telehealth (INDEPENDENT_AMBULATORY_CARE_PROVIDER_SITE_OTHER): Payer: 59 | Admitting: Physician Assistant

## 2021-09-06 VITALS — BP 108/70 | HR 87 | Temp 98.4°F | Ht 74.0 in | Wt 153.2 lb

## 2021-09-06 DIAGNOSIS — J029 Acute pharyngitis, unspecified: Secondary | ICD-10-CM

## 2021-09-06 DIAGNOSIS — N489 Disorder of penis, unspecified: Secondary | ICD-10-CM

## 2021-09-06 LAB — POCT RAPID STREP A (OFFICE): Rapid Strep A Screen: POSITIVE — AB

## 2021-09-06 MED ORDER — CEPHALEXIN 500 MG PO CAPS: 500.0000 mg | ORAL_CAPSULE | Freq: Three times a day (TID) | ORAL | 0 refills | Status: DC

## 2021-09-06 NOTE — Telephone Encounter (Signed)
Nurse states: -Patient seems to have a boil in groin area that has popped and been draining for 4 days. - Due to sore throat, fatigue, and weakness, nurse has suspicion this could be covid but patient hasn't tested.  - Patient requested vv, whic nurse finds reasonable   Nurse's determination was that patient needs to be seen within 24 hours. I was able to schedule patient a VV with Jarold Motto at 11:20am.

## 2021-09-06 NOTE — Progress Notes (Signed)
   I acted as a Neurosurgeon for [employer], PA-C Corky Mull, LPN  Virtual Visit via Video Note   I, Jarold Motto PA, connected with  Scott Benson  ([id number], Mar 13, 2000) on 09/06/21 at 11:20 AM EDT by a video-enabled telemedicine application and verified that I am speaking with the correct person using two identifiers.  Location: Patient: Home Provider:  [facility] office   I discussed the limitations of evaluation and management by telemedicine and the availability of in person appointments. The patient expressed understanding and agreed to proceed.    History of Present Illness: Scott Benson is a 21 y.o. who identifies as a male who was assigned male at birth, and is being seen today for:    Sore throat Pt c/o of sore throat started yesterday, fatigue, chills, body aches. He is using cough drops. Pt smokes Marijuana and thinks that it may be from that. He thinks he has a fever right now. Denies inability to swallow. He has not tried anything for his symptoms.   Skin lesion Pt c/o boil base of penis noticed 4 days ago, started to drain yesterday white puss, hurts to touch. He was sexually active with a male partner without protection a few days ago. She is asymptomatic. He denies his of genital lesions.  Problems:  Patient Active Problem List   Diagnosis Date Noted   Attention and concentration deficit 08/30/2021   Herpes simplex 05/31/2021   Hoarseness of voice 12/23/2020   Rectal itching 12/23/2020   seasonal allergies 09/17/2020    Allergies: No Known Allergies Medications:  Current Outpatient Medications:    valACYclovir (VALTREX) 1000 MG tablet, Take 2 tablets (2,000 mg total) by mouth 2 (two) times daily. Take for 1 day at first sign of tingling on mouth., Disp: 21 tablet, Rfl: 0  Observations/Objective: Patient is well-developed, well-nourished in no acute distress.  Resting comfortably  at home.  Head is normocephalic, atraumatic.  No  labored breathing.  Speech is clear and coherent with logical content.  Patient is alert and oriented at baseline.  Small erythematous lesion at the base of penis  Assessment and Plan: 1. Penile lesion - RPR; Future - HIV antibody (with reflex); Future - HSV(herpes simplex vrs) 1+2 ab-IgG; Future - Urine cytology ancillary only([place]); Future  Difficult to assess if infected hair follicle or other lesion -- I have ordered STI testing and will treat for infection with keflex 500 mg TID x 10 days (also is strep positive), see below. Recommend avoidance of sexual activity until results have returned.  2. Sore throat - POCT rapid strep A Drive-up strep test positive Due to above possible skin infection, start keflex 500 mg TID x 10 days. Follow-up if new/worsening sx  Follow Up Instructions: I discussed the assessment and treatment plan with the patient. The patient was provided an opportunity to ask questions and all were answered. The patient agreed with the plan and demonstrated an understanding of the instructions.  A copy of instructions were sent to the patient via MyChart unless otherwise noted below.   The patient was advised to call back or seek an in-person evaluation if the symptoms worsen or if the condition fails to improve as anticipated.  Jarold Motto, [place]

## 2021-09-06 NOTE — Patient Instructions (Signed)
Please come to our office at 1p today for a strep test.  Blood work and urine tests have been ordered for your skin lesion. To get your labs, you can walk in at the [facility] location without a scheduled appointment.  The address is [address] It is across the street from [facility]. Lab is located in the basement Hours of operation are M-F 8:30am to 5:00pm. Please note that they are closed for lunch between 12:30 and 1:00pm.

## 2021-09-06 NOTE — Telephone Encounter (Signed)
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 It is warm to the touch and red. Caller states that he is feeling weakness , fatigue , and a sore throat. Translation No Nurse Assessment Nurse: Gasper Sells, RN, Marylu Lund Date/Time Lamount Cohen Time): 09/06/2021 10:55:35 AM Confirm and document reason for call. If symptomatic, describe symptoms. ---Caller states that he has cyst that looks like a white head that has some draining near his groin region and it hurts when he touches it. It is warm to the touch and red.(started 4 days ago) Caller states that he is feeling weakness , fatigue , and a sore throat.(2 days ago). Has not done a home covid test. Does the patient have any new or worsening symptoms? ---Yes Will a triage be completed? ---Yes Related visit to physician within the last 2 weeks? ---No Does the PT have any chronic conditions? (i.e. diabetes, asthma, this includes High risk factors for pregnancy, etc.) ---No Is this a behavioral health or substance abuse call? ---No Guidelines Guideline Title Affirmed Question Affirmed Notes Nurse Date/Time Lamount Cohen Time) Boil (Skin Abscess) Boil overlying a joint Gasper Sells, RN, Marylu Lund 8/14//2023 10:57:51 AM  Disp. Time Lamount Cohen Time) Disposition Final User 09/06/2021 11:01:02 AM See PCP  within 24 Hours Yes Gasper Sells RN, Marylu Lund Final Disposition 09/06/2021 11:01:02 AM See PCP within 24 Hours Yes Gasper Sells, RN, Herbert Pun Disagree/Comply Comply Caller Understands Yes PreDisposition Call Doctor Care Advice Given Per Guideline SEE PCP WITHIN 24 HOURS: * IF OFFICE WILL BE OPEN: You need to be examined within the next 24 hours. Call your doctor (or NP/PA) when the office opens and make an appointment. TREATMENT - GENERAL: * Do not squeeze a boil or area of skin infection. Reason: This can push bacteria deeper into the skin. * Wash the area once a day with soap and water. * Avoid touching or scratching the area. * Keep your hands clean. Wash them with soap and water. TREATMENT FOR A BOIL - APPLY MOIST HEAT: * Heat can help bring the boil 'to a head' so that it can open and the pus can drain out. * Apply a warm, wet washcloth to the boil for 15 minutes 3 times a day. * If the boil drains pus: continue to apply a warm wet washcloth to the boil 3 times a day for three more days. TREATMENT - APPLY ANTIBIOTIC OINTMENT: * Apply an over-the-counter antibiotic ointment (e.g., Bacitracin) to the area of redness. * Do this three times daily. DRAINING PUS IS CONTAGIOUS: * Pus or other drainage from an open boil is very contagious. * You can spread it to others. PREVENTING SPREAD TO YOURSELF AND OTHERS: * Wash the boil with soap and water each day. * Keep the boil covered with a clean dry dressing (e.g., gauze pad and tape). Throw the dirty dressings into the regular trash. * Make certain to wash your  hands with soap and water after changing the dressing. * Shower daily with an antibacterial soap. Allow the soap to remain on your skin for five minutes before rinsing. Showers are best because baths still leave many Staph bacteria on the skin. PAIN MEDICINES: * For pain relief, you can take either acetaminophen, ibuprofen, or naproxen. * They are over-the-counter (OTC) pain drugs. You can buy them  at the drugstore. * ACETAMINOPHEN - REGULAR STRENGTH TYLENOL: Take 650 mg (two 325 mg pills) by mouth every 4 to 6 hours as needed. Each Regular Strength Tylenol pill has 325 mg of acetaminophen. The most you should take is 10 pills a day (3,250 mg total). Note: In [place], the maximum is 12 pills a day (3,900 mg total). * ACETAMINOPHEN - EXTRA STRENGTH TYLENOL: Take 1,000 mg (two 500 mg pills) every 6 to 8 hours as needed. Each Extra Strength Tylenol pill has 500 mg of acetaminophen. The most you should take is 6 pills a day (3,000 mg total). Note: In [place], the maximum is 8 pills a day (4,000 mg total). * IBUPROFEN (E.G., MOTRIN, ADVIL): Take 400 mg (two 200 mg pills) by mouth every 6 hours. The most you should take is 6 pills a day (1,200 mg total). CALL BACK IF: * Severe pain or fever occurs * Widespread rash occurs * You become worse CARE ADVICE per Boil (Skin Abscess) (Adult) guideline. Comments User: Jason Coop, RN Date/Time Lamount Cohen Time): 09/06/2021 11:05:17 AM warm tsfrd to office. Pt has not tested for Covid. Office backline, [place], advised appt within 24 hours .Pt wanting antibiotic.May need virtual visit d/t fatigue and sore throat. ? febrile. No cough. Advised to test for Covid. Call if worsens. Referrals REFERRED TO PCP OFFICE Warm transfer to backline

## 2021-09-06 NOTE — Telephone Encounter (Signed)
Patient Name: Scott Benson Gender: Male DOB: 2000/12/02 Age: 21 Y 4 M 9 D Return Phone Number: [phone] (Primary) Address: City/ State/ Zip: 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 Office is transferring pt for triage (before he can be scheduled). Translation No Disp. Time Lamount Cohen Time) Disposition Final User 09/06/2021 8:52:39 AM Attempt made - message left [facility] 09/06/2021 9:21:44 AM Attempt made - message left [facility] 09/06/2021 9:31:57 AM FINAL ATTEMPT MADE - no message left Yes Thana Farr RN, Ladona Ridgel Final Disposition 09/06/2021 9:31:57 AM FINAL ATTEMPT MADE - no message left Yes Thana Farr, RN, Ladona Ridgel

## 2021-09-06 NOTE — Telephone Encounter (Signed)
Patient states: - He has a cyst near his groin region that is red, draining, and warm to the touch  - The cyst "looks like a whitehead"  - He is having weakness, fatigue, body aches, and sore throat   Due to symptoms described, I have sent patient to triage for further evaluation.

## 2021-09-06 NOTE — Telephone Encounter (Signed)
Pleases schedule OV for pt to be seen in OV.

## 2021-09-06 NOTE — Telephone Encounter (Signed)
Patient returned call.  Transferred to Triage.

## 2021-09-07 LAB — URINE CYTOLOGY ANCILLARY ONLY
Chlamydia: NEGATIVE
Comment: NEGATIVE
Comment: NEGATIVE
Comment: NORMAL
Neisseria Gonorrhea: NEGATIVE
Trichomonas: NEGATIVE

## 2021-09-08 LAB — HIV ANTIBODY (ROUTINE TESTING W REFLEX): HIV 1&2 Ab, 4th Generation: NONREACTIVE

## 2021-09-08 LAB — HSV(HERPES SIMPLEX VRS) I + II AB-IGG
HAV 1 IGG,TYPE SPECIFIC AB: 24.7 index — ABNORMAL HIGH
HSV 2 IGG,TYPE SPECIFIC AB: <0.9 index

## 2021-09-08 LAB — RPR: RPR Ser Ql: NONREACTIVE

## 2021-12-01 ENCOUNTER — Ambulatory Visit (INDEPENDENT_AMBULATORY_CARE_PROVIDER_SITE_OTHER): Payer: 59 | Admitting: Family

## 2021-12-01 ENCOUNTER — Encounter: Payer: Self-pay | Admitting: Family

## 2021-12-01 VITALS — BP 104/62 | HR 64 | Temp 97.8°F | Ht 74.0 in | Wt 153.4 lb

## 2021-12-01 DIAGNOSIS — J020 Streptococcal pharyngitis: Secondary | ICD-10-CM

## 2021-12-01 DIAGNOSIS — A630 Anogenital (venereal) warts: Secondary | ICD-10-CM

## 2021-12-01 LAB — POC COVID19 BINAXNOW: SARS Coronavirus 2 Ag: NEGATIVE

## 2021-12-01 LAB — POCT RAPID STREP A (OFFICE): Rapid Strep A Screen: POSITIVE — AB

## 2021-12-01 MED ORDER — AMOXICILLIN 500 MG PO CAPS: 500.0000 mg | ORAL_CAPSULE | Freq: Two times a day (BID) | ORAL | 0 refills | Status: AC

## 2021-12-01 MED ORDER — PODOFILOX 0.5 % EX GEL: CUTANEOUS | 0 refills | Status: DC

## 2021-12-01 NOTE — Progress Notes (Signed)
Patient ID: Scott Benson, male    DOB: 01/03/2001, 21 y.o.   MRN: [id number]  Chief Complaint  Patient presents with   Sore Throat    Pt c/o sore throat, throbbing headache and fatigue. Present since Monday morning. Has tried ibuprofen and advil which did help some.    STD testing    Pt would like STD testing.     HPI:      Sore throat:  Pt c/o sore throat, throbbing headache and fatigue, chills, has no thermometer to check temp. Present since Monday morning. Has tried ibuprofen and advil which did help some. Denies sinus sx.  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  Assessment & Plan:  1. Strep throat rapid strep positive.  Rapid covid neg. Sending AMOX, advised on use & SE, Ibuprofen 600mg  tid for pain & fever. Drink plenty of fluids.  - POCT rapid strep A - POC COVID-19 - amoxicillin (AMOXIL) 500 MG capsule; Take 1 capsule (500 mg total) by mouth 2 (two) times daily for 10 days.  Dispense: 20 capsule; Refill: 0  2. Penile wart - just below tip on shaft, 2 small flat warts clustered together. Vaccine record checked, pt received 3 doses of Gardasil in 2015. Sending topical Podofilox, advised on use & SE.  - podofilox (CONDYLOX) 0.5 % gel; Apply topically every 12 hours in the morning and evening for 3 days, then withhold for 4 days; repeat cycle up to 4 times  Dispense: 3.5 g; Refill: 0    Subjective:    Outpatient Medications Prior to Visit  Medication Sig Dispense Refill   valACYclovir (VALTREX) 1000 MG tablet Take 2 tablets (2,000 mg total) by mouth 2 (two) times daily. Take for 1 day at first sign of tingling on mouth. 21 tablet 0   cephALEXin (KEFLEX) 500 MG capsule Take 1 capsule (500 mg total) by mouth 3 (three) times daily. (Patient not taking: Reported  on 12/01/2021) 30 capsule 0   No facility-administered medications prior to visit.   Past Medical History:  Diagnosis Date   COVID-19    has had it 3 times - rec'd one vaccine   seasonal allergies    flonase as needed works well   Past Surgical History:  Procedure Laterality Date   LAPAROSCOPIC APPENDECTOMY N/A 03/27/2012   Procedure: APPENDECTOMY LAPAROSCOPIC;  Surgeon: Jerilynn Mages. Gerald Stabs, MD;  Location: [facility];  Service: Pediatrics;  Laterality: N/A;   NASAL SINUS SURGERY Right    polyp   WISDOM TOOTH EXTRACTION     No Known Allergies    Objective:    Physical Exam Vitals and nursing note reviewed.  Constitutional:      General: He is not in acute distress.    Appearance: Normal appearance.  HENT:     Head: Normocephalic.     Mouth/Throat:     Mouth: Mucous membranes are moist.     Pharynx: Posterior oropharyngeal erythema and uvula swelling present. No pharyngeal swelling or oropharyngeal exudate.     Tonsils: No tonsillar exudate or tonsillar abscesses.  Cardiovascular:     Rate and Rhythm: Normal rate and regular rhythm.  Pulmonary:     Effort: Pulmonary effort is normal.     Breath sounds: Normal breath  sounds.  Genitourinary:    Penis: Lesions (2-3 flat, clustered, skin colored warts on distal shaft just beneath tip of penis- see pic) present.     Musculoskeletal:        General: Normal range of motion.     Cervical back: Normal range of motion.  Skin:    General: Skin is warm and dry.  Neurological:     Mental Status: He is alert and oriented to person, place, and time.  Psychiatric:        Mood and Affect: Mood normal.    BP 104/62 (BP Location: Left Arm, Patient Position: Sitting, Cuff Size: Large)   Pulse 64   Temp 97.8 F (36.6 C) (Temporal)   Ht 6\' 2"  (1.88 m)   Wt 153 lb 6 oz (69.6 kg)   SpO2 99%   BMI 19.69 kg/m  Wt Readings from Last 3 Encounters:  12/01/21 153 lb 6 oz (69.6 kg)  09/06/21 153 lb 4 oz (69.5 kg)  08/30/21 158 lb (71.7 kg)        10/30/21, NP

## 2021-12-28 ENCOUNTER — Encounter: Payer: Self-pay | Admitting: Family Medicine

## 2021-12-28 ENCOUNTER — Other Ambulatory Visit (HOSPITAL_COMMUNITY)
Admission: RE | Admit: 2021-12-28 | Discharge: 2021-12-28 | Disposition: A | Discharge status: Home / Self Care | Payer: 59 | Source: Ambulatory Visit | Attending: Family Medicine | Admitting: Family Medicine

## 2021-12-28 ENCOUNTER — Ambulatory Visit (INDEPENDENT_AMBULATORY_CARE_PROVIDER_SITE_OTHER): Payer: 59 | Admitting: Family Medicine

## 2021-12-28 VITALS — BP 116/64 | HR 67 | Temp 98.0°F | Resp 16 | Ht 72.0 in | Wt 150.2 lb

## 2021-12-28 DIAGNOSIS — B009 Herpesviral infection, unspecified: Secondary | ICD-10-CM | POA: Diagnosis not present

## 2021-12-28 DIAGNOSIS — T7840XD Allergy, unspecified, subsequent encounter: Secondary | ICD-10-CM | POA: Diagnosis not present

## 2021-12-28 DIAGNOSIS — Z113 Encounter for screening for infections with a predominantly sexual mode of transmission: Secondary | ICD-10-CM | POA: Diagnosis present

## 2021-12-28 DIAGNOSIS — J329 Chronic sinusitis, unspecified: Secondary | ICD-10-CM

## 2021-12-28 MED ORDER — VALACYCLOVIR HCL 1 G PO TABS: 2000.0000 mg | ORAL_TABLET | Freq: Two times a day (BID) | ORAL | 1 refills | Status: DC

## 2021-12-28 MED ORDER — BENZONATATE 200 MG PO CAPS: 200.0000 mg | ORAL_CAPSULE | Freq: Two times a day (BID) | ORAL | 0 refills | Status: DC | PRN

## 2021-12-28 MED ORDER — AZELASTINE HCL 0.1 % NA SOLN: 2.0000 | Freq: Two times a day (BID) | NASAL | 12 refills | Status: DC

## 2021-12-28 NOTE — Progress Notes (Signed)
   Scott Benson is a 21 y.o. male who presents today for an office visit.  Assessment/Plan:  New/Acute Problems: Sinusitis  No red flags.  Symptoms seem to be improving.  We will start Astelin.  Also start Tessalon.  Encouraged hydration.  He will let us know if not improving.  Continue using over-the-counter meds as needed.  Low back pain No red flags.  Likely secondary to poor sleep positioning.  We discussed stretches and heating pads.  He can use over-the-counter meds as needed.  We will try adjusting his sleep positioning to see if this helps.  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  Will refill today.  Seasonal allergies We will be adding on Astelin as above.  He can continue taking his over-the-counter meds.  Preventative Healthcare STI Screening Check urine cytology.     Subjective:  HPI:  Patient here with cough and congestion. Started about 10 days ago. Did have a few sick contacts. Home covid test was negative. Other symptoms include fatigue and malaise. Symptoms improved for a few days but have plateaued. A lot of cough.  Tried taking nyquil and hot tea with honey with modest improvement. No fevers or chills.   Would also like to be screened for STI.  Denies any recent sexual encounter though did have some burning with urination a few weeks ago.  He has also had some issues with low back pain.  This has been intermittent in nature.  He attributes this to his bed frame breaking and sleeping with the mattress concave recently  He now has a new bed frame and a good mattress however still has some occasional pain in his lower back.  He would like to know optimal sleep positioning to help with low back pain.       Objective:  Physical Exam: BP 116/64   Pulse 67   Temp 98 F (36.7 C) (Temporal)   Resp 16   Ht 6' (1.829 m)   Wt 150 lb 3.2 oz (68.1 kg)   SpO2 97%   BMI 20.37 kg/m   Gen: No acute  distress, resting comfortably HEENT: TMs with clear effusion.  OP erythematous.  Nose mucosa erythematous and boggy bilaterally CV: Regular rate and rhythm with no murmurs appreciated Pulm: Normal work of breathing, clear to auscultation bilaterally with no crackles, wheezes, or rhonchi Neuro: Grossly normal, moves all extremities Psych: Normal affect and thought content      Richmond Coldren M. Jimmey Ralph, MD 12/28/2021 7:43 AM

## 2021-12-28 NOTE — Patient Instructions (Signed)
It was very nice to see you today!  You have a upper respiratory infection.  Please start the nasal spray.  Use the cough medication.  Make sure that you are getting plenty of fluids.  I will refill your Valtrex today.  We will check a urine sample today.  Let us know if not improving.  Take care, Dr Jimmey Ralph  PLEASE NOTE:  If you had any lab tests please let us know if you have not heard back within a few days. You may see your results on mychart before we have a chance to review them but we will give you a call once they are reviewed by [place]. If we ordered any referrals today, please let us know if you have not heard from their office within the next week.   Please try these tips to maintain a healthy lifestyle:  Eat at least 3 REAL meals and 1-2 snacks per day.  Aim for no more than 5 hours between eating.  If you eat breakfast, please do so within one hour of getting up.   Each meal should contain half fruits/vegetables, one quarter protein, and one quarter carbs (no bigger than a computer mouse)  Cut down on sweet beverages. This includes juice, soda, and sweet tea.   Drink at least 1 glass of water with each meal and aim for at least 8 glasses per day  Exercise at least 150 minutes every week.

## 2021-12-29 LAB — URINE CYTOLOGY ANCILLARY ONLY
Chlamydia: NEGATIVE
Comment: NEGATIVE
Comment: NORMAL
Neisseria Gonorrhea: NEGATIVE

## 2021-12-30 NOTE — Progress Notes (Signed)
Please inform patient of the following:  STD screenings are all negative.

## 2022-02-21 NOTE — Progress Notes (Unsigned)
   Patient ID: Scott Benson, male    DOB: April 15, 2000, 22 y.o.   MRN: [id number]  No chief complaint on file.   HPI:           Assessment & Plan:     Subjective:    Outpatient Medications Prior to Visit  Medication Sig Dispense Refill  . azelastine (ASTELIN) 0.1 % nasal spray Place 2 sprays into both nostrils 2 (two) times daily. 30 mL 12  . benzonatate (TESSALON) 200 MG capsule Take 1 capsule (200 mg total) by mouth 2 (two) times daily as needed for cough. 20 capsule 0  . podofilox (CONDYLOX) 0.5 % gel Apply topically every 12 hours in the morning and evening for 3 days, then withhold for 4 days; repeat cycle up to 4 times 3.5 g 0  . valACYclovir (VALTREX) 1000 MG tablet Take 2 tablets (2,000 mg total) by mouth 2 (two) times daily. Take for 1 day at first sign of tingling on mouth. 60 tablet 1   No facility-administered medications prior to visit.   Past Medical History:  Diagnosis Date  . COVID-19    has had it 3 times - rec'd one vaccine  . seasonal allergies    flonase as needed works well   Past Surgical History:  Procedure Laterality Date  . LAPAROSCOPIC APPENDECTOMY N/A 03/27/2012   Procedure: APPENDECTOMY LAPAROSCOPIC;  Surgeon: Jerilynn Mages. Gerald Stabs, MD;  Location: [facility];  Service: Pediatrics;  Laterality: N/A;  . NASAL SINUS SURGERY Right    polyp  . WISDOM TOOTH EXTRACTION     No Known Allergies    Objective:    Physical Exam Vitals and nursing note reviewed.  Constitutional:      General: He is not in acute distress.    Appearance: Normal appearance.  HENT:     Head: Normocephalic.  Cardiovascular:     Rate and Rhythm: Normal rate and regular rhythm.  Pulmonary:     Effort: Pulmonary effort is normal.     Breath sounds: Normal breath sounds.  Musculoskeletal:        General: Normal range of motion.     Cervical back: Normal range of motion.  Skin:    General: Skin is warm and dry.  Neurological:     Mental Status: He is alert and oriented to  person, place, and time.  Psychiatric:        Mood and Affect: Mood normal.  There were no vitals taken for this visit. Wt Readings from Last 3 Encounters:  12/28/21 150 lb 3.2 oz (68.1 kg)  12/01/21 153 lb 6 oz (69.6 kg)  09/06/21 153 lb 4 oz (69.5 kg)       Jeanie Sewer, NP

## 2022-02-22 ENCOUNTER — Encounter: Payer: Self-pay | Admitting: Family

## 2022-02-22 ENCOUNTER — Ambulatory Visit (INDEPENDENT_AMBULATORY_CARE_PROVIDER_SITE_OTHER): Payer: 59 | Admitting: Family

## 2022-02-22 VITALS — BP 107/70 | HR 77 | Temp 97.6°F | Ht 72.0 in | Wt 157.4 lb

## 2022-02-22 DIAGNOSIS — K649 Unspecified hemorrhoids: Secondary | ICD-10-CM

## 2022-02-22 DIAGNOSIS — B009 Herpesviral infection, unspecified: Secondary | ICD-10-CM | POA: Diagnosis not present

## 2022-02-22 MED ORDER — HYDROCORTISONE 1 % EX CREA: 1.0000 | TOPICAL_CREAM | Freq: Two times a day (BID) | CUTANEOUS | 0 refills | Status: DC

## 2022-02-22 MED ORDER — LIDOCAINE-HYDROCORTISONE ACE 3-2.5 % RE KIT
PACK | RECTAL | 0 refills | Status: DC
Start: 2022-02-22 — End: 2022-03-17

## 2022-02-22 MED ORDER — VALACYCLOVIR HCL 1 G PO TABS: 2000.0000 mg | ORAL_TABLET | Freq: Two times a day (BID) | ORAL | 1 refills | Status: AC

## 2022-02-28 ENCOUNTER — Ambulatory Visit (INDEPENDENT_AMBULATORY_CARE_PROVIDER_SITE_OTHER): Payer: 59 | Admitting: Family Medicine

## 2022-02-28 ENCOUNTER — Other Ambulatory Visit (HOSPITAL_COMMUNITY)
Admission: RE | Admit: 2022-02-28 | Discharge: 2022-02-28 | Disposition: A | Discharge status: Home / Self Care | Payer: 59 | Source: Ambulatory Visit | Attending: Family Medicine | Admitting: Family Medicine

## 2022-02-28 ENCOUNTER — Encounter: Payer: Self-pay | Admitting: Family Medicine

## 2022-02-28 VITALS — BP 108/80 | HR 72 | Temp 98.6°F | Ht 72.0 in | Wt 153.6 lb

## 2022-02-28 DIAGNOSIS — Z118 Encounter for screening for other infectious and parasitic diseases: Secondary | ICD-10-CM | POA: Insufficient documentation

## 2022-02-28 DIAGNOSIS — Z113 Encounter for screening for infections with a predominantly sexual mode of transmission: Secondary | ICD-10-CM

## 2022-02-28 DIAGNOSIS — Z23 Encounter for immunization: Secondary | ICD-10-CM

## 2022-02-28 DIAGNOSIS — F17200 Nicotine dependence, unspecified, uncomplicated: Secondary | ICD-10-CM | POA: Diagnosis not present

## 2022-02-28 DIAGNOSIS — F1911 Other psychoactive substance abuse, in remission: Secondary | ICD-10-CM

## 2022-02-28 DIAGNOSIS — Z114 Encounter for screening for human immunodeficiency virus [HIV]: Secondary | ICD-10-CM

## 2022-02-28 DIAGNOSIS — Z0000 Encounter for general adult medical examination without abnormal findings: Secondary | ICD-10-CM | POA: Diagnosis present

## 2022-02-28 NOTE — Progress Notes (Signed)
Phone: [phone]    Subjective:  Patient presents today for their annual physical. Chief complaint-noted.   See problem oriented charting- ROS- full  review of systems was completed and negative  Per full ROS sheet completed by patient   The following were reviewed and entered/updated in epic: Past Medical History:  Diagnosis Date   COVID-19    has had it 3 times - rec'd one vaccine   seasonal allergies    flonase as needed works well   Patient Active Problem List   Diagnosis Date Noted   Herpes simplex 05/31/2021    Priority: Low   seasonal allergies 09/17/2020    Priority: Low   Hoarseness of voice 12/23/2020    Priority: 1.   Rectal itching 12/23/2020    Priority: 1.   Attention and concentration deficit 08/30/2021   Past Surgical History:  Procedure Laterality Date   LAPAROSCOPIC APPENDECTOMY N/A 03/27/2012   Procedure: APPENDECTOMY LAPAROSCOPIC;  Surgeon: Jerilynn Mages. Gerald Stabs, MD;  Location: [facility];  Service: Pediatrics;  Laterality: N/A;   NASAL SINUS SURGERY Right    polyp   WISDOM TOOTH EXTRACTION      Family History  Problem Relation Age of Onset   Arthritis Mother    Hypertension Father    Heart attack Father        age 95   Hyperlipidemia Father    Allergic rhinitis Sister    Food Allergy Sister    Healthy Sister    Alcohol abuse Paternal Grandfather    Cancer Paternal Grandfather        thyroid and other   Asthma Neg Hx    Eczema Neg Hx    Urticaria Neg Hx    Immunodeficiency Neg Hx    Angioedema Neg Hx    Atopy Neg Hx     Medications- reviewed and updated Current Outpatient Medications  Medication Sig Dispense Refill   hydrocortisone cream (PREPARATION H) 1 % Apply 1 Application topically 2 (two) times daily. 30 g 0   Lidocaine-Hydrocortisone Ace 3-2.5 % KIT Place rectally twice a day for 7 days maximum 14 kit 0   valACYclovir (VALTREX) 1000 MG tablet Take 2 tablets (2,000 mg total) by mouth 2 (two) times daily. Take for 1 day at first  sign of tingling on mouth. 60 tablet 1   No current facility-administered medications for this visit.    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 Oropharynx normal Neck: no thyromegaly CV: RRR no murmurs rubs or gallops Lungs: CTAB no crackles, wheeze, rhonchi Abdomen: soft/nontender/nondistended/normal bowel sounds. No rebound or guarding.  Ext: no edema Skin: warm, dry Neuro: grossly normal, moves all extremities, PERRLA     Assessment and Plan:  22 y.o. male presenting for annual physical.  Health Maintenance counseling: 1. Anticipatory guidance: Patient counseled regarding regular dental exams -q6 months, eye exams - no vision issues,  avoiding smoking and second hand smoke- see below , limiting alcohol to 2 beverages per day- 1 a week, no illicit drugs- quit marijuana and no recurrence of prior other use.   2. Risk factor reduction:  Advised patient of need for regular exercise and diet rich and fruits and vegetables to reduce risk of heart attack and stroke.  Exercise- 5-7 days a week running, walking, weight lifting, calisthenics .  Diet/weight management- diet has improved from last visit- cut out most sugars - does want to gain some weight though with muscle mass. Cut out processed food.  Wt Readings from Last 3 Encounters:  02/28/22 153 lb 9.6 oz (69.7 kg)  02/22/22 157 lb 6 oz (71.4 kg)  12/28/21 150 lb 3.2 oz (68.1  kg)  3. Immunizations/screenings/ancillary studies-up-to-date but declines further COVID-19 vaccination.  Flu shot today  Immunization History  Administered Date(s) Administered   DTaP 06/30/1999, 09/15/1999, 11/22/1999, 05/03/2000, 05/14/2004   HIB (PRP-OMP) 06/30/1999, 09/15/1999, 05/03/2000   HPV 9-valent 08/07/2013, 06/08/2015, 09/17/2020   HPV Quadrivalent 05/31/2013, 08/07/2013, 12/10/2013   Hepatitis A, Ped/Adol-2 Dose 04/29/2005, 11/16/2005   Hepatitis B, PED/ADOLESCENT May 19, 1999, 06/30/1999, 05/03/2000   Influenza,inj,Quad PF,6+ Mos 02/28/2022   Meningococcal Conjugate 04/29/2005, 11/16/2005   PFIZER Comirnaty(Gray Top)Covid-19 Tri-Sucrose Vaccine 10/22/2019, 11/12/2019   PFIZER(Purple Top)SARS-COV-2 Vaccination 10/22/2019, 11/12/2019   Td 03/29/2021   Tdap 05/24/2010, 09/17/2020  4. Prostate cancer screening-  no family history in first degree relatives, start at age 5  5. Colon cancer screening -  no family history in first degree relative, start at age 25  6. Skin cancer screening/prevention- no dermatologist. advised regular sunscreen use. Denies worrisome, changing, or new skin lesions.  7. Testicular cancer screening- advised monthly self exams  8. STD screening- patient opts in-  Dating but using protection for most part. Did have long term relationship where didn't use protection twice- we will screen to be safe 9. Smoking associated screening-  smoker- at last visit patient was 3 weeks cigarette free and also not vaping-today he reports he has stopped smoking weed but very seldom ciga  (no vaping) - still encouraged full cessation  Status of chronic or acute concerns   # ADD-patient diagnosed as a child reportedly.  We discussed option of trying to pediatric records but with substance abuse history we thought he would be best cared for by psychiatry and referral was made at last visit.  With  -substance abuse history including amphetamines, cocaine, LSD, marijuana, MDMA  we discussed stimulants may not be the most ideal-I also recommend  that time stopping marijuana - he reports he opted to not pursue this- has been able to manage the attention issues better.  He cut down on short form social media in particular has seen improvement  # Screening hyperlipidemia in 2022-largely normal labs Lab Results  Component Value Date   CHOL 163 09/17/2020   HDL 48.40 09/17/2020   LDLCALC 99 09/17/2020   TRIG 76.0 09/17/2020   CHOLHDL 3 09/17/2020   # Gross hematuria-episode in March 2022.  Has been screened for STDs which were negative-but does not appear he was ever seen-has not had recurrence-does agree to urinalysis today he reports no recurrence- thinks it was heavy dehydration related- we will at least check urine   # Herpes simplex-patient with cold sores at last visit and was given Valtrex to use as needed   #  Hemorrhoids -recently seen for this and given steroid cream and suppositories-he reports pretty much gone at this point- is avoiding straining and really helps   #history substance abuse- remains substance free! Has done very well and even cut out marijuana  Recommended follow up: Return in about 1 year (around 03/01/2023) for physical or sooner if needed.Schedule b4 you leave.  Lab/Order associations: fasting   ICD-10-CM   1. Preventative health care  Z00.00 HIV Antibody (routine testing w rflx)    RPR    Urine cytology ancillary only    2. Need for immunization against influenza  Z23 Flu Vaccine QUAD 57mo+IM (Fluarix, Fluzone & Alfiuria Quad PF)    3. Current smoker  F17.200 Urinalysis, Routine w reflex microscopic    4. Screening for gonorrhea  Z11.3 Urine cytology ancillary only    5. Screening for chlamydial disease  Z11.8 Urine cytology ancillary only    6. Screening for HIV (human immunodeficiency virus)  Z11.4 RPR    7. Screening for venereal disease  Z11.3 HIV Antibody (routine testing w rflx)      No orders of the defined types were  placed in this encounter.   Return precautions advised.   Garret Reddish, MD

## 2022-02-28 NOTE — Patient Instructions (Addendum)
   Please stop by lab before you go If you have mychart- we will send your results within 3 business days of [place] receiving them.  If you do not have mychart- we will call you about results within 5 business days of [place] receiving them.  *please also note that you will see labs on mychart as soon as they post. I will later go in and write notes on them- will say "notes from Dr. Yong Channel"   Recommended follow up: Return in about 1 year (around 03/01/2023) for physical or sooner if needed.Schedule b4 you leave.

## 2022-03-01 LAB — URINALYSIS, ROUTINE W REFLEX MICROSCOPIC
Bilirubin Urine: NEGATIVE
Ketones, ur: NEGATIVE
Nitrite: NEGATIVE
Specific Gravity, Urine: <=1.005 — AB (ref 1.000–1.030)
Total Protein, Urine: NEGATIVE
Urine Glucose: NEGATIVE
Urobilinogen, UA: 0.2 (ref 0.0–1.0)
pH: 7 (ref 5.0–8.0)

## 2022-03-01 LAB — HIV ANTIBODY (ROUTINE TESTING W REFLEX): HIV 1&2 Ab, 4th Generation: NONREACTIVE

## 2022-03-01 LAB — RPR: RPR Ser Ql: NONREACTIVE

## 2022-03-02 LAB — URINE CYTOLOGY ANCILLARY ONLY
Chlamydia: NEGATIVE
Comment: NEGATIVE
Comment: NEGATIVE
Comment: NORMAL
Neisseria Gonorrhea: NEGATIVE
Trichomonas: NEGATIVE

## 2022-03-17 ENCOUNTER — Ambulatory Visit (INDEPENDENT_AMBULATORY_CARE_PROVIDER_SITE_OTHER): Payer: 59 | Admitting: Family

## 2022-03-17 ENCOUNTER — Encounter: Payer: Self-pay | Admitting: Family

## 2022-03-17 DIAGNOSIS — K649 Unspecified hemorrhoids: Secondary | ICD-10-CM | POA: Diagnosis not present

## 2022-03-17 MED ORDER — HYDROCORTISONE ACETATE 25 MG RE SUPP: 25.0000 mg | Freq: Two times a day (BID) | RECTAL | 1 refills | Status: DC

## 2022-03-17 MED ORDER — HYDROCORTISONE 1 % EX CREA: 1.0000 | TOPICAL_CREAM | Freq: Two times a day (BID) | CUTANEOUS | 5 refills | Status: DC

## 2022-03-17 NOTE — Progress Notes (Signed)
Patient ID: Scott Benson, male    DOB: [birth year], 22 y.o.   MRN: [id number]  Chief Complaint  Patient presents with   Hemorrhoids    HPI:      Hemorrhoids:  Pt not knowing what it is, pt came in and treated for hemorrhiods and fissures, went way and sx have come back worse.  Reports using the Lidocaine/HC rectal kit prescribed last visit & then pain went away. States he started having sx again 4 days ago. States he has a lump on outside of anus, he thinks, denies any bleeding. Also reports itching. Reports his bowels have been moving daily most days and they are soft, denies any straining.  Assessment & Plan:  1. Hemorrhoids, unspecified hemorrhoid type last rectal kit cost pt $100; sending generic meds, advised they may not be covered by ins., but he can find OTC. Advised on use & SE. Continue to eat high fiber diet, drink at least 2 liters of water qd, and can take OTC stool softenor qd or qod to ensure soft BM qd.  - hydrocortisone cream (PREPARATION H) 1 %; Apply 1 Application topically 2 (two) times daily.  Dispense: 30 g; Refill: 5 - hydrocortisone (ANUSOL-HC) 25 MG suppository; Place 1 suppository (25 mg total) rectally 2 (two) times daily.  Dispense: 12 suppository; Refill: 1  Subjective:    Outpatient Medications Prior to Visit  Medication Sig Dispense Refill   valACYclovir (VALTREX) 1000 MG tablet Take 2 tablets (2,000 mg total) by mouth 2 (two) times daily. Take for 1 day at first sign of tingling on mouth. 60 tablet 1   hydrocortisone cream (PREPARATION H) 1 % Apply 1 Application topically 2 (two) times daily. 30 g 0   Lidocaine-Hydrocortisone Ace 3-2.5 % KIT Place rectally twice a day for 7 days maximum 14 kit 0   No facility-administered medications prior to visit.   Past Medical History:  Diagnosis Date   COVID-19    has had it 3 times - rec'd one vaccine   seasonal allergies    flonase as needed works well   Past Surgical History:  Procedure Laterality Date    LAPAROSCOPIC APPENDECTOMY N/A 03/27/2012   Procedure: APPENDECTOMY LAPAROSCOPIC;  Surgeon: Jerilynn Mages. Gerald Stabs, MD;  Location: [facility];  Service: Pediatrics;  Laterality: N/A;   NASAL SINUS SURGERY Right    polyp   WISDOM TOOTH EXTRACTION     No Known Allergies    Objective:    Physical Exam Vitals and nursing note reviewed.  Constitutional:      General: He is not in acute distress.    Appearance: Normal appearance.  HENT:     Head: Normocephalic.  Cardiovascular:     Rate and Rhythm: Normal rate and regular rhythm.  Pulmonary:     Effort: Pulmonary effort is normal.     Breath sounds: Normal breath sounds.  Musculoskeletal:        General: Normal range of motion.     Cervical back: Normal range of motion.  Skin:    General: Skin is warm and dry.     Comments: Anus with mild erythema on perianal tissue, no external hemorrhoid noted, no guarding pain with digital exam, no internal hemorrhoid palpated.  Neurological:     Mental Status: He is alert and oriented to person, place, and time.  Psychiatric:        Mood and Affect: Mood normal.    BP 96/60   Pulse (!) 55   Temp 98.6  F (37 C) (Temporal)   Ht 6' (1.829 m)   Wt 153 lb 12.8 oz (69.8 kg)   SpO2 99%   BMI 20.86 kg/m  Wt Readings from Last 3 Encounters:  03/17/22 153 lb 12.8 oz (69.8 kg)  02/28/22 153 lb 9.6 oz (69.7 kg)  02/22/22 157 lb 6 oz (71.4 kg)       Jeanie Sewer, NP

## 2022-03-18 ENCOUNTER — Telehealth: Payer: 59 | Admitting: Physician Assistant

## 2022-03-18 DIAGNOSIS — L03011 Cellulitis of right finger: Secondary | ICD-10-CM

## 2022-03-18 MED ORDER — CEPHALEXIN 500 MG PO CAPS: 500.0000 mg | ORAL_CAPSULE | Freq: Four times a day (QID) | ORAL | 0 refills | Status: DC

## 2022-03-18 MED ORDER — MUPIROCIN 2 % EX OINT: 1.0000 | TOPICAL_OINTMENT | Freq: Two times a day (BID) | CUTANEOUS | 0 refills | Status: DC

## 2022-03-18 NOTE — Progress Notes (Signed)
I have spent 5 minutes in review of e-visit questionnaire, review and updating patient chart, medical decision making and response to patient.   Myshawn Chiriboga Cody Atziry Baranski, PA-C    

## 2022-03-18 NOTE — Progress Notes (Signed)
E Visit for Cellulitis  We are sorry that you are not feeling well. Here is how we plan to help!  Based on what you shared with me it looks like you have Paronychia, or an infection of the nailbed and surrounding skin/tissues. These can lead to a larger area of infection called cellulitis.  Cellulitis looks like areas of skin redness, swelling, and warmth; it develops as a result of bacteria entering under the skin. Little red spots and/or bleeding can be seen in skin, and tiny surface sacs containing fluid can occur. Fever can be present. Cellulitis is almost always on one side of a body, and the lower limbs are the most common site of involvement.   I have prescribed:  Keflex '500mg'$  take one by mouth four times a day for 5 days. I have also sent in a topical antibiotic to apply in a think layer around the area, as directed. We do recommend soaking the finger first in a basin of warm water and a 1/4 cup of epsom salt for 10 minutes 1-2 x daily. Patting completely dry before applying the topical antibiotic.  HOME CARE:  Take your medications as ordered and take all of them, even if the skin irritation appears to be healing.   GET HELP RIGHT AWAY IF:  Symptoms that don't begin to go away within 48 hours. Severe redness persists or worsens If the area turns color, spreads or swells. If it blisters and opens, develops yellow-brown crust or bleeds. You develop a fever or chills. If the pain increases or becomes unbearable.  Are unable to keep fluids and food down.  MAKE SURE YOU   Understand these instructions. Will watch your condition. Will get help right away if you are not doing well or get worse.  Thank you for choosing an e-visit.  Your e-visit answers were reviewed by a board certified advanced clinical practitioner to complete your personal care plan. Depending upon the condition, your plan could have included both over the counter or prescription medications.  Please review your  pharmacy choice. Make sure the pharmacy is open so you can pick up prescription now. If there is a problem, you may contact your provider through [employer] and have the prescription routed to another pharmacy.  Your safety is important to [place]. If you have drug allergies check your prescription carefully.   For the next 24 hours you can use MyChart to ask questions about today's visit, request a non-urgent call back, or ask for a work or school excuse. You will get an email in the next two days asking about your experience. I hope that your e-visit has been valuable and will speed your recovery.

## 2022-04-27 ENCOUNTER — Encounter: Payer: Self-pay | Admitting: Family

## 2022-04-27 ENCOUNTER — Ambulatory Visit (INDEPENDENT_AMBULATORY_CARE_PROVIDER_SITE_OTHER): Payer: 59 | Admitting: Family

## 2022-04-27 VITALS — BP 109/64 | HR 53 | Temp 97.8°F | Ht 72.0 in | Wt 153.6 lb

## 2022-04-27 DIAGNOSIS — H65191 Other acute nonsuppurative otitis media, right ear: Secondary | ICD-10-CM | POA: Diagnosis not present

## 2022-04-27 MED ORDER — AMOXICILLIN 500 MG PO CAPS: 1000.0000 mg | ORAL_CAPSULE | Freq: Three times a day (TID) | ORAL | 0 refills | Status: DC

## 2022-04-27 NOTE — Progress Notes (Signed)
Patient ID: Scott Benson, male    DOB: 2001/01/17, 22 y.o.   MRN: [id number]  Chief Complaint  Patient presents with   Ear Pain    sx for 1 week    HPI:  Ear pain:   Pt c/o right ear pain and tightness for about a week. Pt states he was disoriented while in the air in [employer]. Denies any allergy or sinus sx prior to ear pain.      Assessment & Plan:  1. Other non-recurrent acute nonsuppurative otitis media of right ear - sending AMOX, advised on use & SE. OK to take Tylenol of Ibuprofen 600mg  tid or 1-2 generic Aleve bid prn for pain.  - amoxicillin (AMOXIL) 500 MG capsule; Take 2 capsules (1,000 mg total) by mouth 3 (three) times daily.  Dispense: 30 capsule; Refill: 0    Subjective:    Outpatient Medications Prior to Visit  Medication Sig Dispense Refill   hydrocortisone (ANUSOL-HC) 25 MG suppository Place 1 suppository (25 mg total) rectally 2 (two) times daily. 12 suppository 1   hydrocortisone cream (PREPARATION H) 1 % Apply 1 Application topically 2 (two) times daily. 30 g 5   mupirocin ointment (BACTROBAN) 2 % Apply 1 Application topically 2 (two) times daily. 22 g 0   valACYclovir (VALTREX) 1000 MG tablet Take 2 tablets (2,000 mg total) by mouth 2 (two) times daily. Take for 1 day at first sign of tingling on mouth. 60 tablet 1   cephALEXin (KEFLEX) 500 MG capsule Take 1 capsule (500 mg total) by mouth 4 (four) times daily. 20 capsule 0   No facility-administered medications prior to visit.   Past Medical History:  Diagnosis Date   COVID-19    has had it 3 times - rec'd one vaccine   seasonal allergies    flonase as needed works well   Past Surgical History:  Procedure Laterality Date   LAPAROSCOPIC APPENDECTOMY N/A 03/27/2012   Procedure: APPENDECTOMY LAPAROSCOPIC;  Surgeon: Jerilynn Mages. Gerald Stabs, MD;  Location: [place];  Service: Pediatrics;  Laterality: N/A;   NASAL SINUS SURGERY Right    polyp   WISDOM TOOTH EXTRACTION     No Known Allergies     Objective:    Physical Exam Vitals and nursing note reviewed.  Constitutional:      General: He is not in acute distress.    Appearance: Normal appearance.  HENT:     Head: Normocephalic.     Right Ear: Tenderness present. No swelling. Tympanic membrane is injected and erythematous. Tympanic membrane is not bulging.     Left Ear: Tympanic membrane and ear canal normal.  Cardiovascular:     Rate and Rhythm: Normal rate and regular rhythm.  Pulmonary:     Effort: Pulmonary effort is normal.     Breath sounds: Normal breath sounds.  Musculoskeletal:        General: Normal range of motion.     Cervical back: Normal range of motion.  Lymphadenopathy:     Head:     Right side of head: No submandibular, preauricular, posterior auricular or occipital adenopathy.     Left side of head: No submandibular, preauricular, posterior auricular or occipital adenopathy.  Skin:    General: Skin is warm and dry.  Neurological:     Mental Status: He is alert and oriented to person, place, and time.  Psychiatric:        Mood and Affect: Mood normal.    BP 109/64 (BP Location:  Left Arm, Patient Position: Sitting, Cuff Size: Normal)   Pulse (!) 53   Temp 97.8 F (36.6 C) (Temporal)   Ht 6' (1.829 m)   Wt 153 lb 9.6 oz (69.7 kg)   SpO2 98%   BMI 20.83 kg/m  Wt Readings from Last 3 Encounters:  04/27/22 153 lb 9.6 oz (69.7 kg)  03/17/22 153 lb 12.8 oz (69.8 kg)  02/28/22 153 lb 9.6 oz (69.7 kg)      Jeanie Sewer, NP

## 2022-06-13 ENCOUNTER — Encounter: Payer: Self-pay | Admitting: Physician Assistant

## 2022-06-13 ENCOUNTER — Ambulatory Visit (INDEPENDENT_AMBULATORY_CARE_PROVIDER_SITE_OTHER): Payer: 59 | Admitting: Physician Assistant

## 2022-06-13 VITALS — BP 100/70 | HR 46 | Temp 97.7°F | Ht 72.0 in | Wt 153.2 lb

## 2022-06-13 DIAGNOSIS — R21 Rash and other nonspecific skin eruption: Secondary | ICD-10-CM

## 2022-06-13 MED ORDER — METHYLPREDNISOLONE ACETATE 40 MG/ML IJ SUSP
40.0000 mg | Freq: Once | INTRAMUSCULAR | Status: AC
  Administered 2022-06-13: 40 mg via INTRAMUSCULAR

## 2022-06-13 MED ORDER — PREDNISONE 10 MG PO TABS: ORAL_TABLET | ORAL | 0 refills | Status: DC

## 2022-06-13 NOTE — Progress Notes (Signed)
Scott Benson is a 22 y.o. male here for a new problem.  History of Present Illness:   Chief Complaint  Patient presents with   Rash    Pt c/o rash on arms, elbows, knees noticed 2 days ago, no itching. He has put hydrocortisone cream last night and took a Benadryl.    HPI  Rash: He complains of a rash on his hands, arms, elbows, knees, and feet starting 2 days ago.  He took Benadryl and used hydrocortisone cream last night.  Denies any itching, flu-like symptoms, hx of eczema, and no new joint pain. He denies any known exposure and has not recently been in a hot tub or woods.  No new food changes.  No pets at home.   Past Medical History:  Diagnosis Date   COVID-19    has had it 3 times - rec'd one vaccine   seasonal allergies    flonase as needed works well     Social History   Tobacco Use   Smoking status: Some Days    Types: Cigars   Smokeless tobacco: Former    Types: Snuff  Vaping Use   Vaping Use: Former  Substance Use Topics   Alcohol use: Yes    Alcohol/week: 6.0 standard drinks of alcohol    Types: 1 Glasses of wine, 5 Shots of liquor per week    Comment: usually less   Drug use: Yes    Types: Marijuana, Amphetamines, Cocaine, LSD, MDMA (Ecstacy)    Comment: currently using marijuana everything else is past usage.    Past Surgical History:  Procedure Laterality Date   LAPAROSCOPIC APPENDECTOMY N/A 03/27/2012   Procedure: APPENDECTOMY LAPAROSCOPIC;  Surgeon: Judie Petit. [name], MD;  Location: [facility];  Service: Pediatrics;  Laterality: N/A;   NASAL SINUS SURGERY Right    polyp   WISDOM TOOTH EXTRACTION      Family History  Problem Relation Age of Onset   Arthritis Mother    Hypertension Father    Heart attack Father        age 45   Hyperlipidemia Father    Allergic rhinitis Sister    Food Allergy Sister    Healthy Sister    Alcohol abuse Paternal Grandfather    Cancer Paternal Grandfather        thyroid and other   Asthma Neg Hx     Eczema Neg Hx    Urticaria Neg Hx    Immunodeficiency Neg Hx    Angioedema Neg Hx    Atopy Neg Hx     No Known Allergies  Current Medications:   Current Outpatient Medications:    valACYclovir (VALTREX) 1000 MG tablet, Take 2 tablets (2,000 mg total) by mouth 2 (two) times daily. Take for 1 day at first sign of tingling on mouth., Disp: 60 tablet, Rfl: 1   Review of Systems:   Review of Systems  Skin:  Positive for rash (hands, arms, elbows, knees, and feet -- see HPI).    Vitals:   Vitals:   06/13/22 1133  BP: 100/70  Pulse: (!) 46  Temp: 97.7 F (36.5 C)  TempSrc: Temporal  SpO2: 98%  Weight: 153 lb 4 oz (69.5 kg)  Height: 6' (1.829 m)     Body mass index is 20.78 kg/m.  Physical Exam:   Physical Exam Vitals and nursing note reviewed.  Constitutional:      Appearance: He is well-developed.  HENT:     Head: Normocephalic.  Eyes:  Conjunctiva/sclera: Conjunctivae normal.     Pupils: Pupils are equal, round, and reactive to light.  Pulmonary:     Effort: Pulmonary effort is normal.  Musculoskeletal:        General: Normal range of motion.     Cervical back: Normal range of motion.  Skin:    General: Skin is warm and dry.     Comments: Erythematous papules in bilateral arms and legs; most concentrated on bilateral elbows; no areas of fluctuance or induration  Neurological:     Mental Status: He is alert and oriented to person, place, and time.  Psychiatric:        Behavior: Behavior normal.        Thought Content: Thought content normal.        Judgment: Judgment normal.     Assessment and Plan:   Rash and nonspecific skin eruption No red flags Unclear etiology; possible contact dermatitis 40 mg depo-medrol injection provided today Start oral prednisone 2 week course tomorrow Start daily antihistamine of choice Follow-up if any new/worsening sx   I,Rachel Rivera,acting as a scribe for [employer], PA.,have documented all relevant  documentation on the behalf of Jarold Motto, PA,as directed by  Jarold Motto, PA while in the presence of Jarold Motto, [place].  I, Jarold Motto, [place], have reviewed all documentation for this visit. The documentation on 06/13/22 for the exam, diagnosis, procedures, and orders are all accurate and complete.   Jarold Motto, PA-C

## 2022-06-13 NOTE — Patient Instructions (Signed)
It was great to see you!  Start over the counter antihistamines such as Zyrtec (cetirizine), Claritin (loratadine), Allegra (fexofenadine), or Xyzal (levocetirizine) daily x 2 weeks.  Start oral prednisone TOMORROW as prescribed  We are doing a steroid injection today  If you develop any fevers, chills, pain, or any other NEW symptoms, call us ASAP and let us know so we can advise further.  Take care,  Jarold Motto PA-C

## 2022-08-13 DIAGNOSIS — R5383 Other fatigue: Secondary | ICD-10-CM | POA: Diagnosis not present

## 2022-08-13 DIAGNOSIS — J069 Acute upper respiratory infection, unspecified: Secondary | ICD-10-CM | POA: Diagnosis not present

## 2022-08-20 IMAGING — CR DG CHEST 2V
2 series · 2 of 2 positions shown · non-contrast
Comparison: None.

CLINICAL DATA: Acute cough for 3 weeks. History of COVID 1 year
ago.

EXAM:
CHEST - 2 VIEW

[w chest lat]
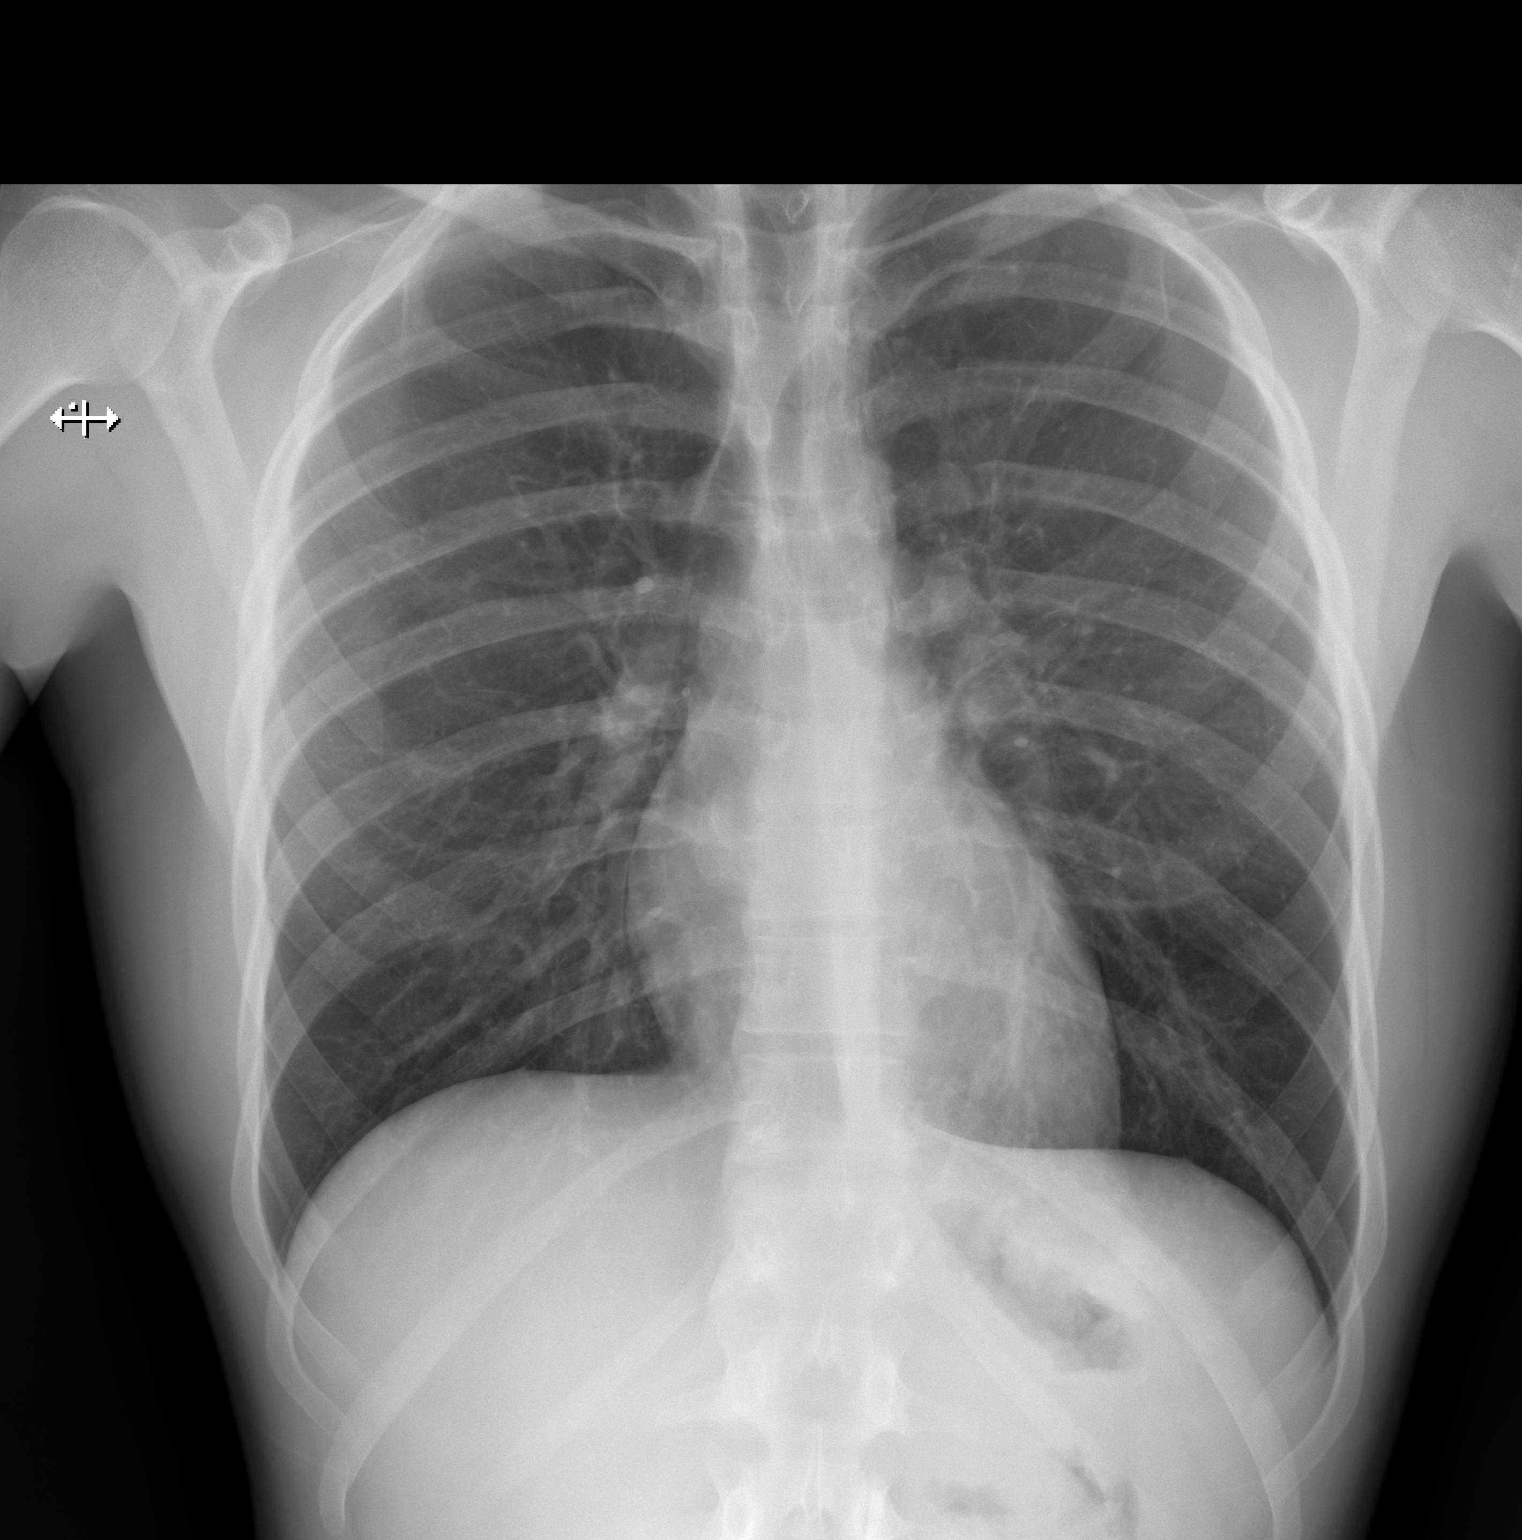

[w chest ap]
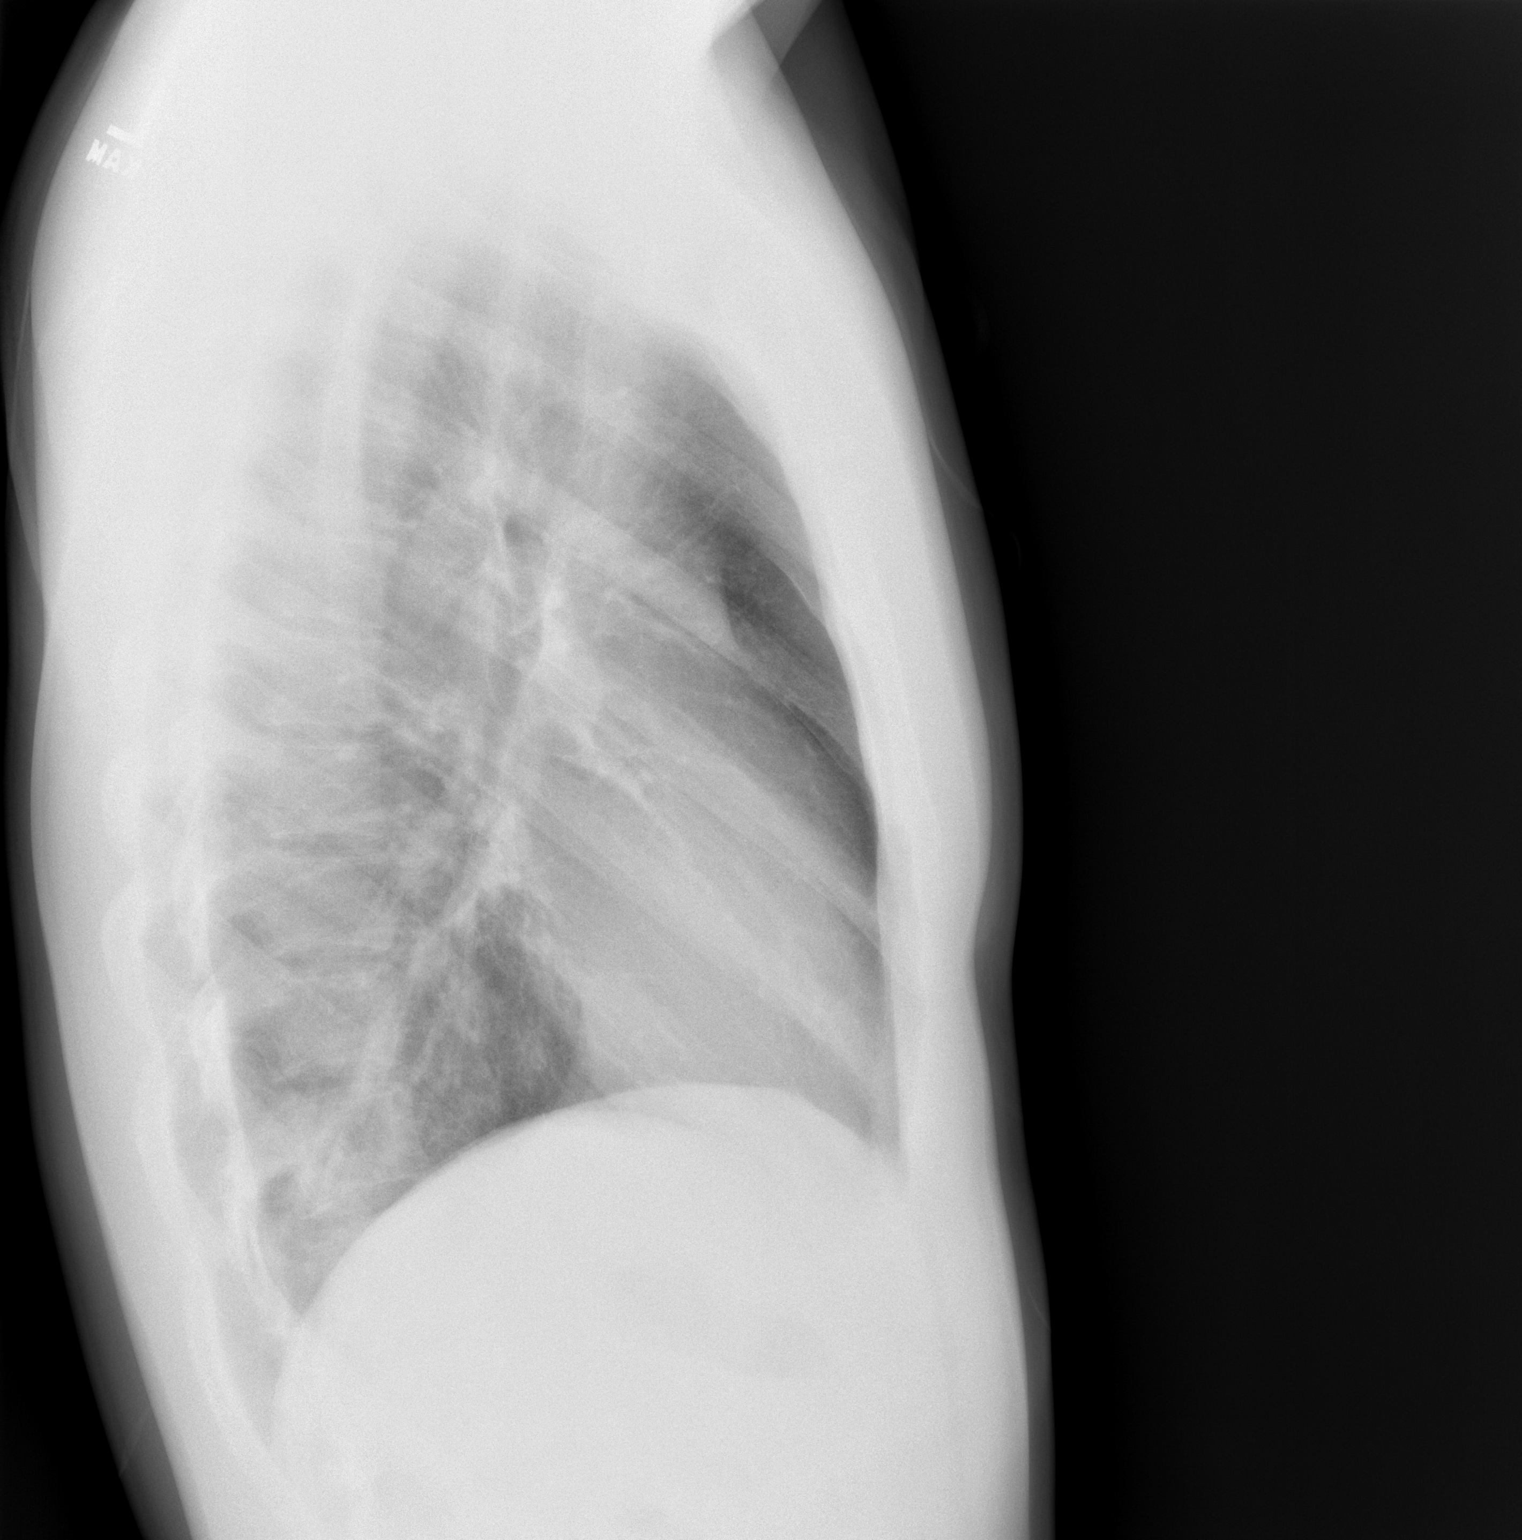

[2 of 2 positions shown; findings below may reference images not displayed]

FINDINGS: The cardiomediastinal contours are normal. Minimal patchy opacity in
the left lower lobe. Pulmonary vasculature is normal. No pleural
effusion or pneumothorax. No acute osseous abnormalities are seen.
IMPRESSION: Minimal patchy opacity in the left lower lobe, may reflect
atelectasis or pneumonia.

## 2022-08-23 ENCOUNTER — Encounter: Payer: Self-pay | Admitting: Family

## 2022-08-23 ENCOUNTER — Ambulatory Visit (INDEPENDENT_AMBULATORY_CARE_PROVIDER_SITE_OTHER): Payer: 59 | Admitting: Family

## 2022-08-23 VITALS — BP 114/72 | HR 65 | Temp 98.2°F | Resp 18 | Ht 72.0 in | Wt 146.0 lb

## 2022-08-23 DIAGNOSIS — R053 Chronic cough: Secondary | ICD-10-CM

## 2022-08-23 MED ORDER — PREDNISONE 10 MG PO TABS: ORAL_TABLET | ORAL | 0 refills | Status: DC

## 2022-08-23 NOTE — Progress Notes (Signed)
Patient ID: Scott Benson, male    DOB: 04-14-2000, 22 y.o.   MRN: [id number]  Chief Complaint  Patient presents with   Laryngitis    Sx started about 2 1/2 weeks ago, went to UC, tested negative for mono, covid and strep    Headache   Cough    Productive cough, has been taking Mucinex, Dayquil, Nyquil, benadryl, getting plenty of fluids and resting   Nasal Congestion    HPI:      URI sx:   still coughing and voice is very raspy. He went to UC 10d ago and all testing negative, given Hydroxyzine which has just made him very drowsy, stopped taking. Most of his other symptoms are better. But he has to be able to talk with his job.     Assessment & Plan:  1. Persistent cough- sending prednisone, advised pt on use & SE. Continue to take Mucinex prn, drink plenty of fluids.  - predniSONE (DELTASONE) 10 MG tablet; Take with breakfast. (OK to start first day now) 6 tab day 1, 5 tab day 2, 4 tab day 3, 3 tab day 6, 2 tab day 7.  Dispense: 20 tablet; Refill: 0  Subjective:    Outpatient Medications Prior to Visit  Medication Sig Dispense Refill   hydrOXYzine (ATARAX) 10 MG tablet T 1-3 tab po q 4-6 hr prn     valACYclovir (VALTREX) 1000 MG tablet Take 2 tablets (2,000 mg total) by mouth 2 (two) times daily. Take for 1 day at first sign of tingling on mouth. 60 tablet 1   predniSONE (DELTASONE) 10 MG tablet Take two tablets daily x 1 week, then one tablet daily x 1 week. 21 tablet 0   No facility-administered medications prior to visit.   Past Medical History:  Diagnosis Date   COVID-19    has had it 3 times - rec'd one vaccine   seasonal allergies    flonase as needed works well   Past Surgical History:  Procedure Laterality Date   LAPAROSCOPIC APPENDECTOMY N/A 03/27/2012   Procedure: APPENDECTOMY LAPAROSCOPIC;  Surgeon: Scott Petit. [name], MD;  Location: [facility];  Service: Pediatrics;  Laterality: N/A;   NASAL SINUS SURGERY Right    polyp   WISDOM TOOTH EXTRACTION     No Known  Allergies    Objective:    Physical Exam Vitals and nursing note reviewed.  Constitutional:      General: He is not in acute distress.    Appearance: Normal appearance.  HENT:     Head: Normocephalic.  Cardiovascular:     Rate and Rhythm: Normal rate and regular rhythm.  Pulmonary:     Effort: Pulmonary effort is normal.     Breath sounds: Normal breath sounds.  Musculoskeletal:        General: Normal range of motion.     Cervical back: Normal range of motion.  Skin:    General: Skin is warm and dry.  Neurological:     Mental Status: He is alert and oriented to person, place, and time.  Psychiatric:        Mood and Affect: Mood normal.    BP 114/72   Pulse 65   Temp 98.2 F (36.8 C) (Temporal)   Resp 18   Ht 6' (1.829 m)   Wt 146 lb (66.2 kg)   SpO2 94%   BMI 19.80 kg/m  Wt Readings from Last 3 Encounters:  08/23/22 146 lb (66.2 kg)  06/13/22 153 lb  4 oz (69.5 kg)  04/27/22 153 lb 9.6 oz (69.7 kg)      Dulce Sellar, NP

## 2022-09-06 ENCOUNTER — Ambulatory Visit (INDEPENDENT_AMBULATORY_CARE_PROVIDER_SITE_OTHER): Payer: 59 | Admitting: Family

## 2022-09-06 VITALS — BP 103/67 | HR 68 | Temp 97.7°F | Ht 72.0 in | Wt 154.2 lb

## 2022-09-06 DIAGNOSIS — R490 Dysphonia: Secondary | ICD-10-CM | POA: Diagnosis not present

## 2022-09-06 NOTE — Progress Notes (Signed)
Patient ID: Scott Benson, male    DOB: 11-Jan-2001, 22 y.o.   MRN: [id number]  Chief Complaint  Patient presents with   Hoarse    Pt c/o hoarseness for 3 weeks, Has tried prednisone, mucinex, hot tea and xlear nasal spray which did not help.     HPI:     URI sx:   HX from last visit 08/23/2022:  still coughing and voice is very raspy. He went to [facility] 7/20 and all testing negative, given Hydroxyzine which has just made him very drowsy, stopped taking. Most of his other symptoms are better. But he has to be able to talk with his job. *Last visit given prednisone dose pack. pt reports it helped all his other sx but still hoarse and he works on [employer] talking all day and hasn't been able to work since this started. States he has an appt next week with ENT.    Assessment & Plan:  1. Hoarseness of voice- pt has been seen by allergist & ENT in past for same sx, non-compliant with meds. Advised with his multiple allergies he should be on the steroid nasal spray bid daily when having sx, then reduce to daily. Has been given Astelin nasal spray as well in past and advised on taking daily oral antihistamine as well. Also given Pepcid trial in past, but denies any reflux or heartburn sx. Believe sx are allergy related and exacerbated with viral illness. Has appt next week with ENT again.    Subjective:    Outpatient Medications Prior to Visit  Medication Sig Dispense Refill   valACYclovir (VALTREX) 1000 MG tablet Take 2 tablets (2,000 mg total) by mouth 2 (two) times daily. Take for 1 day at first sign of tingling on mouth. 60 tablet 1   hydrOXYzine (ATARAX) 10 MG tablet T 1-3 tab po q 4-6 hr prn (Patient not taking: Reported on 09/06/2022)     predniSONE (DELTASONE) 10 MG tablet Take with breakfast. (OK to start first day now) 6 tab day 1, 5 tab day 2, 4 tab day 3, 3 tab day 6, 2 tab day 7. (Patient not taking: Reported on 09/06/2022) 20 tablet 0   No facility-administered medications prior to  visit.   Past Medical History:  Diagnosis Date   COVID-19    has had it 3 times - rec'd one vaccine   seasonal allergies    flonase as needed works well   Past Surgical History:  Procedure Laterality Date   LAPAROSCOPIC APPENDECTOMY N/A 03/27/2012   Procedure: APPENDECTOMY LAPAROSCOPIC;  Surgeon: Judie Petit. [name], MD;  Location: [facility];  Service: Pediatrics;  Laterality: N/A;   NASAL SINUS SURGERY Right    polyp   WISDOM TOOTH EXTRACTION     No Known Allergies    Objective:    Physical Exam Vitals and nursing note reviewed.  Constitutional:      General: He is not in acute distress.    Appearance: Normal appearance.  HENT:     Head: Normocephalic.     Right Ear: Tympanic membrane and ear canal normal.     Left Ear: Tympanic membrane and ear canal normal.     Mouth/Throat:     Mouth: Mucous membranes are moist.     Pharynx: Posterior oropharyngeal erythema (mild) and postnasal drip present. No pharyngeal swelling, oropharyngeal exudate or uvula swelling.     Tonsils: No tonsillar exudate or tonsillar abscesses. 1+ on the right. 2+ on the left.  Cardiovascular:  Rate and Rhythm: Normal rate and regular rhythm.  Pulmonary:     Effort: Pulmonary effort is normal.     Breath sounds: Normal breath sounds.  Musculoskeletal:        General: Normal range of motion.     Cervical back: Normal range of motion.  Lymphadenopathy:     Head:     Right side of head: No tonsillar, preauricular, posterior auricular or occipital adenopathy.     Left side of head: No tonsillar, preauricular, posterior auricular or occipital adenopathy.     Cervical: No cervical adenopathy.  Skin:    General: Skin is warm and dry.  Neurological:     Mental Status: He is alert and oriented to person, place, and time.  Psychiatric:        Mood and Affect: Mood normal.    BP 103/67   Pulse 68   Temp 97.7 F (36.5 C) (Temporal)   Ht 6' (1.829 m)   Wt 154 lb 3.2 oz (69.9 kg)   SpO2 96%   BMI  20.91 kg/m  Wt Readings from Last 3 Encounters:  09/06/22 154 lb 3.2 oz (69.9 kg)  08/23/22 146 lb (66.2 kg)  06/13/22 153 lb 4 oz (69.5 kg)      Dulce Sellar, NP

## 2022-09-06 NOTE — Assessment & Plan Note (Signed)
Seen in ER recently, told he had a virus and should resolve. Denies heartburn, but does have postnasal drip, states he is allergic "to everything", but does not take a daily med. Did use Nasacort before that got rid of his sinus symptoms so he stopped using. Advised restarting this daily or twice a day for the next week and see if hoarseness improves. If not, then I also sent pepcid to try for one week to rule out GERD sx possibly causing the hoarseness.

## 2022-09-13 DIAGNOSIS — R490 Dysphonia: Secondary | ICD-10-CM | POA: Diagnosis not present

## 2022-12-29 ENCOUNTER — Ambulatory Visit (INDEPENDENT_AMBULATORY_CARE_PROVIDER_SITE_OTHER): Payer: 59 | Admitting: Family

## 2022-12-29 ENCOUNTER — Encounter: Payer: Self-pay | Admitting: Family

## 2022-12-29 DIAGNOSIS — L290 Pruritus ani: Secondary | ICD-10-CM

## 2022-12-29 DIAGNOSIS — K649 Unspecified hemorrhoids: Secondary | ICD-10-CM | POA: Diagnosis not present

## 2022-12-29 MED ORDER — HYDROCORTISONE ACETATE 25 MG RE SUPP: 25.0000 mg | Freq: Two times a day (BID) | RECTAL | 2 refills | Status: AC

## 2022-12-29 NOTE — Progress Notes (Signed)
Patient ID: Scott Benson, male    DOB: November 10, 2000, 22 y.o.   MRN: [id number]  Chief Complaint  Patient presents with   Hemorrhoids    Medication refill   HPI: Hemorrhoid: pt has hx of hemorrhoids treated for hemorrhiods and fissures, which went way and sx have come back worse. Reports using the Lidocaine/HC rectal kit prescribed last visit & then pain went away. States he started having sx again 4 days ago. States he has a lump on outside of anus, he thinks, denies any bleeding. Also reports itching. Reports his bowels have not been moving daily and not eating as much fiber as he usually does.  Assessment & Plan:   Hemorrhoids, unspecified hemorrhoid type - Resume eating a high fiber diet, drinking at least 2 liters of water qd, and can take OTC stool softenor qd or qod to ensure soft BM qd.  - hydrocortisone (ANUSOL-HC) 25 MG suppository; Place 1 suppository (25 mg total) rectally 2 (two) times daily.  Dispense: 12 suppository; Refill: 2  Subjective:    Outpatient Medications Prior to Visit  Medication Sig Dispense Refill   valACYclovir (VALTREX) 1000 MG tablet Take 2 tablets (2,000 mg total) by mouth 2 (two) times daily. Take for 1 day at first sign of tingling on mouth. 60 tablet 1   hydrOXYzine (ATARAX) 10 MG tablet T 1-3 tab po q 4-6 hr prn (Patient not taking: Reported on 12/29/2022)     predniSONE (DELTASONE) 10 MG tablet Take with breakfast. (OK to start first day now) 6 tab day 1, 5 tab day 2, 4 tab day 3, 3 tab day 6, 2 tab day 7. (Patient not taking: Reported on 12/29/2022) 20 tablet 0   No facility-administered medications prior to visit.   Past Medical History:  Diagnosis Date   COVID-19    has had it 3 times - rec'd one vaccine   seasonal allergies    flonase as needed works well   Past Surgical History:  Procedure Laterality Date   LAPAROSCOPIC APPENDECTOMY N/A 03/27/2012   Procedure: APPENDECTOMY LAPAROSCOPIC;  Surgeon: Judie Petit. [name], MD;  Location: [facility];   Service: Pediatrics;  Laterality: N/A;   NASAL SINUS SURGERY Right    polyp   WISDOM TOOTH EXTRACTION     No Known Allergies    Objective:    Physical Exam Vitals and nursing note reviewed.  Constitutional:      General: He is not in acute distress.    Appearance: Normal appearance.  HENT:     Head: Normocephalic.  Cardiovascular:     Rate and Rhythm: Normal rate and regular rhythm.  Pulmonary:     Effort: Pulmonary effort is normal.     Breath sounds: Normal breath sounds.  Musculoskeletal:        General: Normal range of motion.     Cervical back: Normal range of motion.  Skin:    General: Skin is warm and dry.  Neurological:     Mental Status: He is alert and oriented to person, place, and time.  Psychiatric:        Mood and Affect: Mood normal.    BP 109/69 (BP Location: Left Arm, Patient Position: Sitting, Cuff Size: Large)   Pulse 89   Temp 98 F (36.7 C) (Temporal)   Ht 6' (1.829 m)   Wt 159 lb (72.1 kg)   SpO2 96%   BMI 21.56 kg/m  Wt Readings from Last 3 Encounters:  12/29/22 159 lb (72.1 kg)  09/06/22 154 lb 3.2 oz (69.9 kg)  08/23/22 146 lb (66.2 kg)      Dulce Sellar, NP
